# Patient Record
Sex: Male | Born: 1960 | Race: White | Hispanic: No | Marital: Married | Smoking: Never smoker
Health system: Southern US, Community
[De-identification: ages and names within clinical notes are randomized; demographics above are authoritative.]

## PROBLEM LIST (undated history)

## (undated) DIAGNOSIS — I10 Essential (primary) hypertension: Secondary | ICD-10-CM

## (undated) DIAGNOSIS — Z87442 Personal history of urinary calculi: Secondary | ICD-10-CM

## (undated) DIAGNOSIS — N281 Cyst of kidney, acquired: Secondary | ICD-10-CM

## (undated) DIAGNOSIS — K760 Fatty (change of) liver, not elsewhere classified: Secondary | ICD-10-CM

## (undated) DIAGNOSIS — R112 Nausea with vomiting, unspecified: Secondary | ICD-10-CM

## (undated) DIAGNOSIS — Z9889 Other specified postprocedural states: Secondary | ICD-10-CM

## (undated) DIAGNOSIS — K219 Gastro-esophageal reflux disease without esophagitis: Secondary | ICD-10-CM

## (undated) DIAGNOSIS — H9319 Tinnitus, unspecified ear: Secondary | ICD-10-CM

## (undated) DIAGNOSIS — N200 Calculus of kidney: Secondary | ICD-10-CM

## (undated) DIAGNOSIS — K5730 Diverticulosis of large intestine without perforation or abscess without bleeding: Secondary | ICD-10-CM

## (undated) HISTORY — DX: Cyst of kidney, acquired: N28.1

## (undated) HISTORY — DX: Gastro-esophageal reflux disease without esophagitis: K21.9

## (undated) HISTORY — PX: APPENDECTOMY: SHX54

## (undated) HISTORY — DX: Essential (primary) hypertension: I10

## (undated) HISTORY — PX: HERNIA REPAIR: SHX51

## (undated) HISTORY — DX: Fatty (change of) liver, not elsewhere classified: K76.0

## (undated) HISTORY — DX: Calculus of kidney: N20.0

## (undated) HISTORY — DX: Diverticulosis of large intestine without perforation or abscess without bleeding: K57.30

---

## 1993-10-06 DIAGNOSIS — R112 Nausea with vomiting, unspecified: Secondary | ICD-10-CM

## 1993-10-06 DIAGNOSIS — Z9889 Other specified postprocedural states: Secondary | ICD-10-CM

## 1993-10-06 HISTORY — DX: Nausea with vomiting, unspecified: R11.2

## 1993-10-06 HISTORY — DX: Other specified postprocedural states: Z98.890

## 1999-01-12 ENCOUNTER — Emergency Department (HOSPITAL_COMMUNITY): Admission: EM | Admit: 1999-01-12 | Discharge: 1999-01-13 | Payer: Self-pay | Admitting: Emergency Medicine

## 1999-01-13 ENCOUNTER — Encounter: Payer: Self-pay | Admitting: Emergency Medicine

## 2010-10-09 ENCOUNTER — Ambulatory Visit: Payer: Self-pay | Admitting: Cardiology

## 2010-10-11 ENCOUNTER — Emergency Department (HOSPITAL_BASED_OUTPATIENT_CLINIC_OR_DEPARTMENT_OTHER)
Admission: EM | Admit: 2010-10-11 | Discharge: 2010-10-12 | Payer: Self-pay | Source: Home / Self Care | Admitting: Emergency Medicine

## 2010-10-21 LAB — DIFFERENTIAL
Basophils Absolute: 0 10*3/uL (ref 0.0–0.1)
Basophils Relative: 1 % (ref 0–1)
Eosinophils Absolute: 0.2 10*3/uL (ref 0.0–0.7)
Eosinophils Relative: 2 % (ref 0–5)
Lymphocytes Relative: 37 % (ref 12–46)
Lymphs Abs: 3.2 10*3/uL (ref 0.7–4.0)
Monocytes Absolute: 0.9 10*3/uL (ref 0.1–1.0)
Monocytes Relative: 10 % (ref 3–12)
Neutro Abs: 4.2 10*3/uL (ref 1.7–7.7)
Neutrophils Relative %: 50 % (ref 43–77)

## 2010-10-21 LAB — POCT CARDIAC MARKERS
CKMB, poc: <1 ng/mL — ABNORMAL LOW (ref 1.0–8.0)
CKMB, poc: <1 ng/mL — ABNORMAL LOW (ref 1.0–8.0)
CKMB, poc: <1 ng/mL — ABNORMAL LOW (ref 1.0–8.0)
Myoglobin, poc: 36.3 ng/mL (ref 12–200)
Myoglobin, poc: 39.6 ng/mL (ref 12–200)
Myoglobin, poc: 52.8 ng/mL (ref 12–200)
Troponin i, poc: <0.05 ng/mL (ref 0.00–0.09)
Troponin i, poc: <0.05 ng/mL (ref 0.00–0.09)
Troponin i, poc: <0.05 ng/mL (ref 0.00–0.09)

## 2010-10-21 LAB — COMPREHENSIVE METABOLIC PANEL
ALT: 94 U/L — ABNORMAL HIGH (ref 0–53)
AST: 30 U/L (ref 0–37)
Albumin: 4.5 g/dL (ref 3.5–5.2)
Alkaline Phosphatase: 73 U/L (ref 39–117)
BUN: 20 mg/dL (ref 6–23)
CO2: 29 mEq/L (ref 19–32)
Calcium: 9.6 mg/dL (ref 8.4–10.5)
Chloride: 101 mEq/L (ref 96–112)
Creatinine, Ser: 1.3 mg/dL (ref 0.4–1.5)
GFR calc Af Amer: >60 mL/min (ref >60)
GFR calc non Af Amer: 59 mL/min — ABNORMAL LOW (ref >60)
Glucose, Bld: 112 mg/dL — ABNORMAL HIGH (ref 70–99)
Potassium: 4.1 mEq/L (ref 3.5–5.1)
Sodium: 143 mEq/L (ref 135–145)
Total Bilirubin: 0.9 mg/dL (ref 0.3–1.2)
Total Protein: 7.4 g/dL (ref 6.0–8.3)

## 2010-10-21 LAB — CBC
HCT: 46.8 % (ref 39.0–52.0)
Hemoglobin: 16.3 g/dL (ref 13.0–17.0)
MCH: 30.9 pg (ref 26.0–34.0)
MCHC: 34.8 g/dL (ref 30.0–36.0)
MCV: 88.6 fL (ref 78.0–100.0)
Platelets: 140 10*3/uL — ABNORMAL LOW (ref 150–400)
RBC: 5.28 MIL/uL (ref 4.22–5.81)
RDW: 12.9 % (ref 11.5–15.5)
WBC: 8.4 10*3/uL (ref 4.0–10.5)

## 2010-10-21 LAB — D-DIMER, QUANTITATIVE: D-Dimer, Quant: <0.22 ug/mL-FEU (ref 0.00–0.48)

## 2010-10-25 ENCOUNTER — Ambulatory Visit: Payer: Self-pay | Admitting: Cardiology

## 2010-10-27 ENCOUNTER — Encounter: Payer: Self-pay | Admitting: *Deleted

## 2010-10-27 ENCOUNTER — Encounter: Payer: Self-pay | Admitting: Family Medicine

## 2010-10-29 ENCOUNTER — Encounter (HOSPITAL_COMMUNITY): Admission: RE | Admit: 2010-10-29 | Payer: Self-pay | Source: Home / Self Care | Admitting: Cardiology

## 2010-10-31 ENCOUNTER — Telehealth (INDEPENDENT_AMBULATORY_CARE_PROVIDER_SITE_OTHER): Payer: Self-pay | Admitting: Radiology

## 2010-11-04 ENCOUNTER — Encounter (HOSPITAL_COMMUNITY)
Admission: RE | Admit: 2010-11-04 | Discharge: 2010-11-05 | Payer: Self-pay | Source: Home / Self Care | Attending: Cardiology | Admitting: Cardiology

## 2010-11-04 ENCOUNTER — Encounter: Payer: Self-pay | Admitting: Cardiology

## 2010-11-04 ENCOUNTER — Encounter: Payer: Self-pay | Admitting: *Deleted

## 2010-11-04 ENCOUNTER — Ambulatory Visit: Admission: RE | Admit: 2010-11-04 | Discharge: 2010-11-04 | Payer: Self-pay | Source: Home / Self Care

## 2010-11-07 NOTE — Progress Notes (Signed)
Summary: Nuc Pre-Procedure  Phone Note Outgoing Call Call back at Home Phone [phone]   Call placed by: Henrine Screws Call placed to: Patient Reason for Call: Confirm/change Appt Summary of Call: Reviewed information on My[employer] Information Sheet (see scanned document for further details).  Spoke with patient.     Nuclear Med Background Indications for Stress Test: Evaluation for Ischemia, Abnormal EKG   History: Myocardial Perfusion Study  History Comments: 2009- Normal MPS. EF= 53%. Recent ED visit for sinusitis, developed CP, MI ruled out.  Symptoms: Chest Pain, Palpitations    Nuclear Pre-Procedure Cardiac Risk Factors: Family History - CAD, Hypertension, Lipids

## 2010-11-13 NOTE — Assessment & Plan Note (Signed)
Summary: Cardiology Nuclear Testing  Nuclear Med Background Indications for Stress Test: Evaluation for Ischemia, Post Hospital, Abnormal EKG  Indications Comments: 10/12/10 chest pain, MI r/o  History: Myocardial Perfusion Study  History Comments: '09MPS:normal, EF=53%  Symptoms: Chest Pain, DOE, Palpitations  Symptoms Comments: Last episode of [id number].   Nuclear Pre-Procedure Cardiac Risk Factors: Family History - CAD, Hypertension, Lipids Caffeine/Decaff Intake: 2230 NPO After: 7:00 PM Lungs: Clear IV 0.9% NS with Angio Cath: 20g     IV Site: R Hand IV Started by: Cathlyn Parsons, RN Chest Size (in) 42     Height (in): 74 Weight (lb): 207 BMI: 26.67  Nuclear Med Study 1 or 2 day study:  1 day     Stress Test Type:  Stress Reading MD:  Cassell Clement, MD     Referring MD:  Roger Shelter, MD Resting Radionuclide:  Technetium 36m Tetrofosmin     Resting Radionuclide Dose:  11 mCi  Stress Radionuclide:  Technetium 6m Tetrofosmin     Stress Radionuclide Dose:  33 mCi   Stress Protocol Exercise Time (min):  9:00 min     Max HR:  173 bpm     Predicted Max HR:  171 bpm  Max Systolic BP: 181 mm Hg     Percent Max HR:  101.17 %     METS: 10.1 Rate Pressure Product:  64332    Stress Test Technologist:  Rea College, CMA-N     Nuclear Technologist:  Domenic Polite, CNMT  Rest Procedure  Myocardial perfusion imaging was performed at rest 45 minutes following the intravenous administration of Technetium 64m Tetrofosmin.  Stress Procedure  The patient exercised for nine minutes utilizing the Bruce protocol.  The patient stopped due to fatigue and denied any chest pain.  There were no diagnostic ST-T wave changes, only occasional PVC's and PAC's.  Technetium 62m Tetrofosmin was injected at peak exercise and myocardial perfusion imaging was performed after a brief delay.  QPS Raw Data Images:  Normal; no motion artifact; normal heart/lung ratio. Stress Images:  Normal  homogeneous uptake in all areas of the myocardium. Rest Images:  Normal homogeneous uptake in all areas of the myocardium. Subtraction (SDS):  No evidence of ischemia. Transient Ischemic Dilatation:  .97  (Normal <1.22)  Lung/Heart Ratio:  .29  (Normal <0.45)  Quantitative Gated Spect Images QGS EDV:  101 ml QGS ESV:  42 ml QGS EF:  59 %  Findings Normal nuclear study      Overall Impression  Exercise Capacity: Good exercise capacity. BP Response: Normal blood pressure response. Clinical Symptoms: No chest pain ECG Impression: No significant ST segment change suggestive of ischemia. Overall Impression: Normal stress nuclear study. Overall Impression Comments: Normal wall motion.  Appended Document: Cardiology Nuclear Testing copy sent to Dr. Melburn Popper

## 2011-07-29 ENCOUNTER — Other Ambulatory Visit: Payer: Self-pay | Admitting: Family Medicine

## 2011-07-29 DIAGNOSIS — R1011 Right upper quadrant pain: Secondary | ICD-10-CM

## 2011-07-31 ENCOUNTER — Ambulatory Visit
Admission: RE | Admit: 2011-07-31 | Discharge: 2011-07-31 | Disposition: A | Discharge status: Home / Self Care | Payer: 59 | Source: Ambulatory Visit | Attending: Family Medicine | Admitting: Family Medicine

## 2011-07-31 DIAGNOSIS — R1011 Right upper quadrant pain: Secondary | ICD-10-CM

## 2011-09-02 ENCOUNTER — Ambulatory Visit: Payer: Self-pay | Admitting: Cardiology

## 2011-09-10 ENCOUNTER — Encounter: Payer: Self-pay | Admitting: Cardiology

## 2011-09-10 ENCOUNTER — Encounter: Payer: Self-pay | Admitting: *Deleted

## 2011-09-11 ENCOUNTER — Ambulatory Visit (INDEPENDENT_AMBULATORY_CARE_PROVIDER_SITE_OTHER): Payer: 59 | Admitting: Cardiology

## 2011-09-11 ENCOUNTER — Encounter: Payer: Self-pay | Admitting: Cardiology

## 2011-09-11 DIAGNOSIS — E785 Hyperlipidemia, unspecified: Secondary | ICD-10-CM

## 2011-09-11 DIAGNOSIS — I10 Essential (primary) hypertension: Secondary | ICD-10-CM

## 2011-09-11 NOTE — Progress Notes (Signed)
HPI: Pleasant male previously followed by Dr.Tennant for followup of hypertension and chest pain. Last nuclear study was performed in January of 2012. This revealed an ejection fraction of 59% and normal perfusion. Note lipitor caused elevation of LFTs in the past and zocor caused myalgias. He was last seen in Jan 2012.  Since then, the patient denies any dyspnea on exertion, orthopnea, PND, pedal edema, palpitations, syncope or chest pain.   Current Outpatient Prescriptions  Medication Sig Dispense Refill  . ASTEPRO 0.15 % SOLN As directed      . BENICAR HCT 20-12.5 MG per tablet 1/2 TAB PO QD       . LEVITRA 20 MG tablet PRN      . NASONEX 50 MCG/ACT nasal spray prn      . RABEprazole (ACIPHEX) 20 MG tablet Take 20 mg by mouth daily.        Marland Kitchen zolpidem (AMBIEN) 10 MG tablet Take 1 tablet by mouth Daily.         Past Medical History  Diagnosis Date  . Hyperlipidemia   . Pneumothorax   . Hypertension     Past Surgical History  Procedure Date  . Appendectomy   . Hernia repair     History   Social History  . Marital Status: Legally Separated    Spouse Name: N/A    Number of Children: N/A  . Years of Education: N/A   Occupational History  . Not on file.   Social History Main Topics  . Smoking status: Never Smoker   . Smokeless tobacco: Not on file  . Alcohol Use: Not on file  . Drug Use: Not on file  . Sexually Active: Not on file   Other Topics Concern  . Not on file   Social History Narrative  . No narrative on file    ROS: some problems with reflux and epigastric discomfort that increases with lying on his side but no fevers or chills, productive cough, hemoptysis, dysphasia, odynophagia, melena, hematochezia, dysuria, hematuria, rash, seizure activity, orthopnea, PND, pedal edema, claudication. Remaining systems are negative.  Physical Exam: Well-developed well-nourished in no acute distress.  Skin is warm and dry.  HEENT is normal.  Neck is supple. No  thyromegaly.  Chest is clear to auscultation with normal expansion.  Cardiovascular exam is regular rate and rhythm.  Abdominal exam nontender or distended. No masses palpated. Extremities show no edema. neuro grossly intact  ECG NSR with nonspecific ST changes.

## 2011-09-11 NOTE — Assessment & Plan Note (Signed)
Management per primary care. 

## 2011-09-11 NOTE — Patient Instructions (Signed)
Your physician wants you to follow-up in:  12 months.  You will receive a reminder letter in the mail two months in advance. If you don't receive a letter, please call our office to schedule the follow-up appointment.   

## 2011-09-11 NOTE — Assessment & Plan Note (Signed)
Blood pressure controlled. Continue present medications. Potassium and renal function monitored by primary care. He does have a family history of coronary disease. Plan followup stress test in the future.

## 2011-10-07 HISTORY — PX: COLONOSCOPY: SHX174

## 2012-04-11 IMAGING — US US ABDOMEN COMPLETE
1 series · 13 of 25 positions shown · non-contrast
Comparison: Report of CT abdomen and pelvis 01/13/1999 describing
a left UVJ calculus; those images have been purged.  No prior
ultrasound.

CLINICAL DATA: Chronic right upper quadrant abdominal pain over
the past year or so.

COMPLETE ABDOMINAL ULTRASOUND 07/31/2011:

[Series 1: us abdomen complete · 0.28mm/px · 13 of 61 slices shown]
[im 1/61]
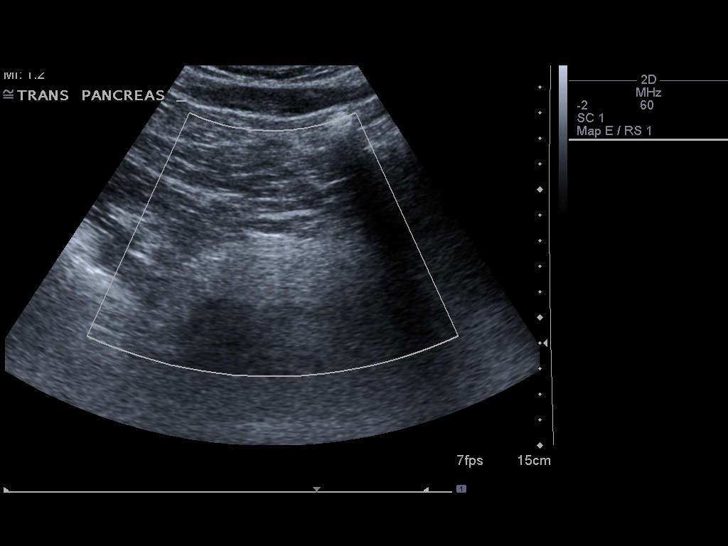
[im 6/61]
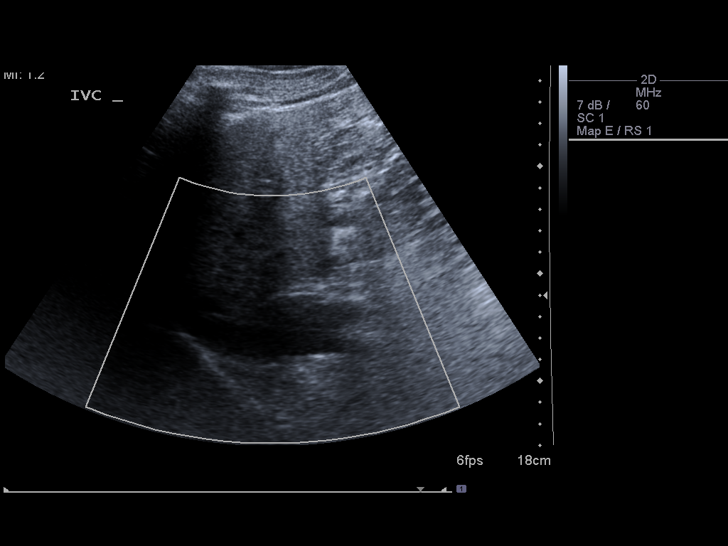
[im 11/61]
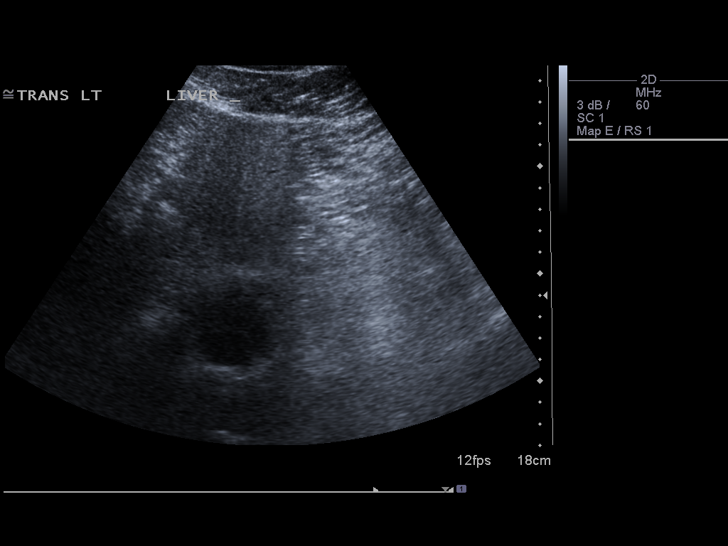
[im 16/61]
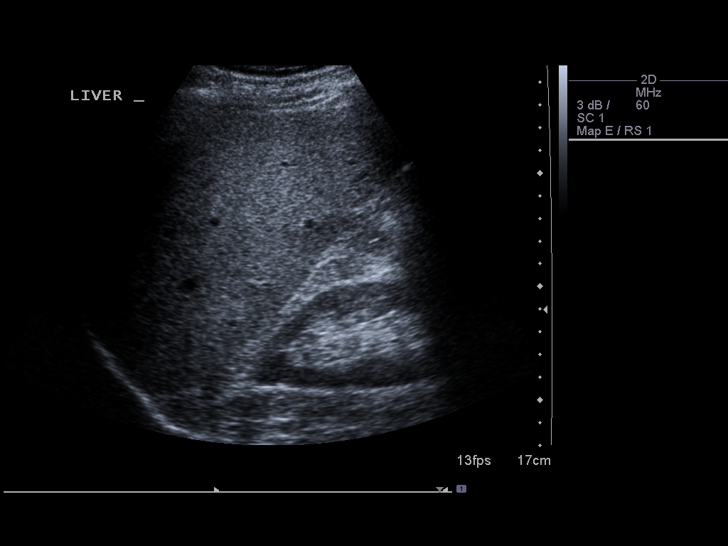
[im 21/61]
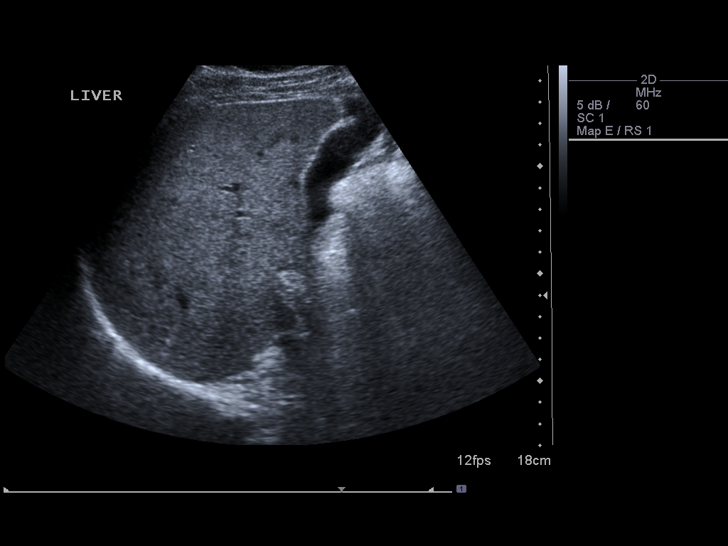
[im 26/61]
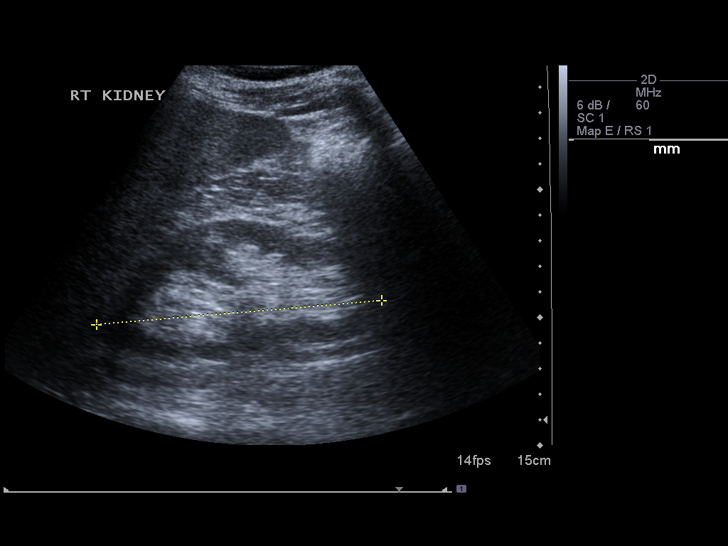
[im 31/61]
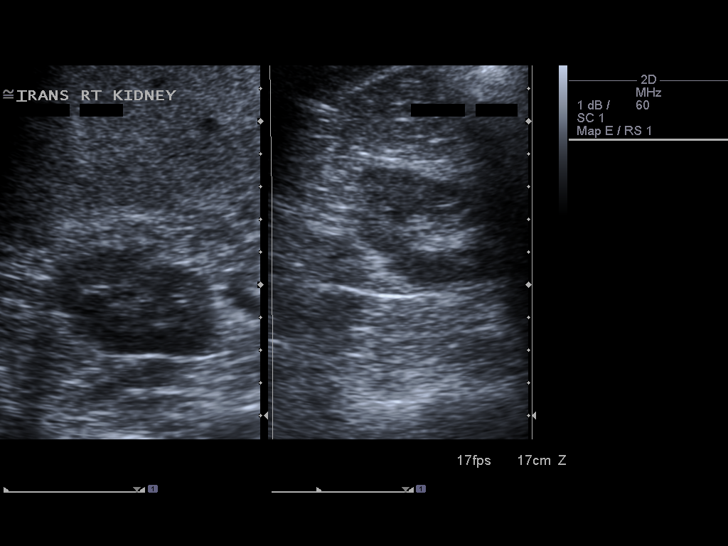
[im 36/61]
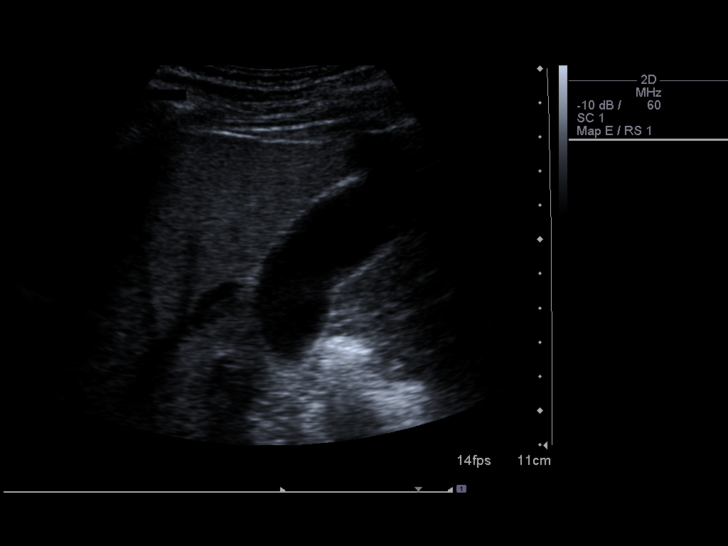
[im 41/61]
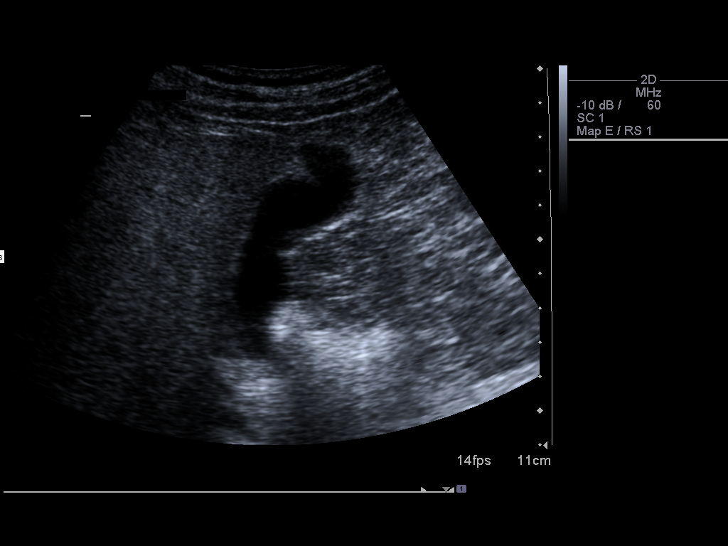
[im 46/61]
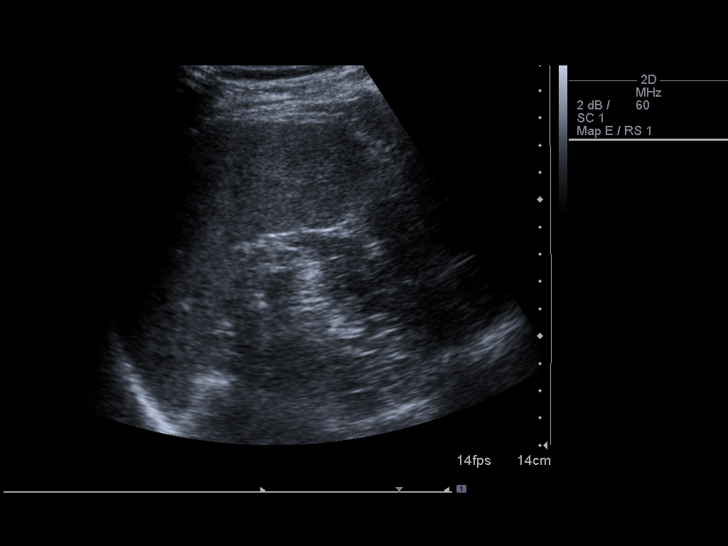
[im 51/61]
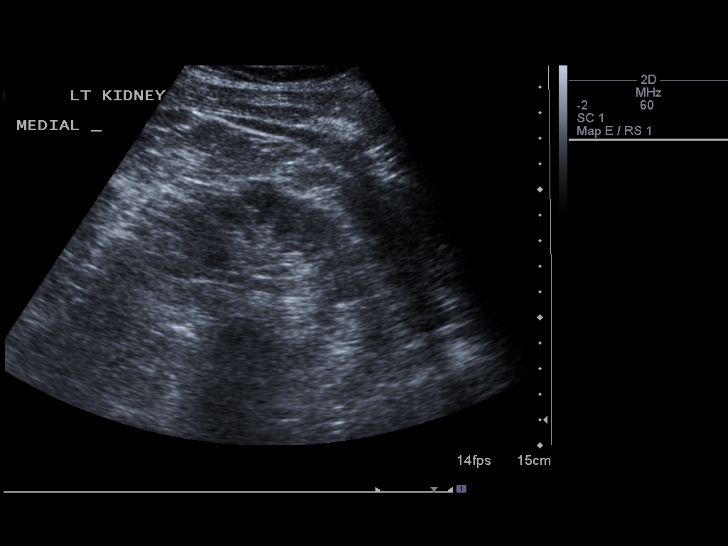
[im 56/61]
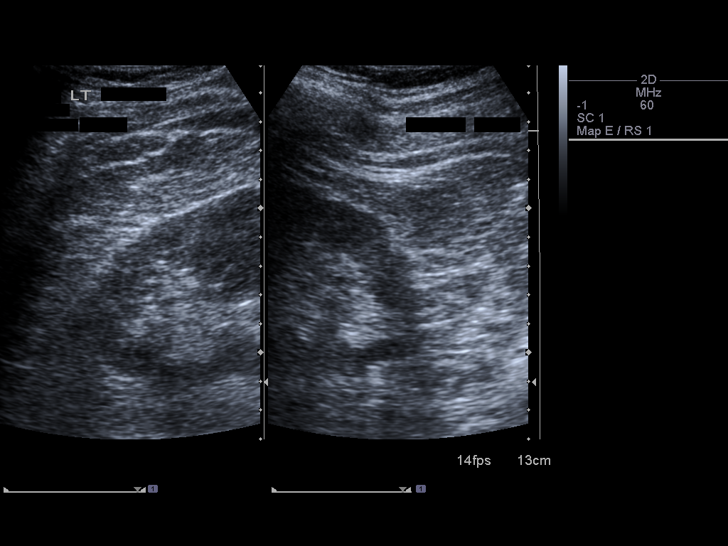
[im 61/61]
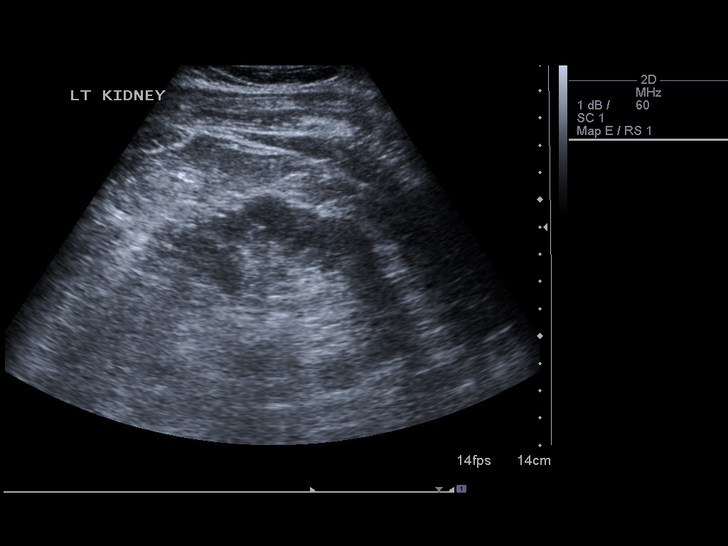

[13 of 25 positions shown; findings below may reference images not displayed]

FINDINGS: Gallbladder:  No shadowing gallstones or echogenic sludge.  No
gallbladder wall thickening or pericholecystic fluid.  Negative
sonographic Murphy's sign according to the ultrasound technologist.

Common bile duct:  Normal in caliber with maximum diameter
approximating 2 mm.

Liver:  Normal in size with diffuselyincreased and coarsened
echotexture.  No focal hepatic parenchymal abnormality.  Patent
portal vein with hepatopedal flow.

IVC:  Patent in its intrahepatic portion.  Obscured outside the
liver by bowel gas.

Pancreas:  Normal appearing head and body.  Tail obscured by
overlying bowel gas.

Spleen:  Normal size and echotexture without focal parenchymal
abnormality.

Right Kidney:  No hydronephrosis.  Well-preserved cortex.  No
shadowing calculi.  Normal size and parenchymal echotexture without
focal abnormalities.  Approximately 11.2 cm in length.

Left Kidney:  No hydronephrosis.  Well-preserved cortex.  No
shadowing calculi.  Normal size and parenchymal echotexture.
Approximate 1.7 cm likely complex cyst arising from the mid kidney.
No other focal parenchymal abnormalities.  Approximately 12.6 cm in
length.

Abdominal aorta:  Normal in caliber throughout its visualized
course in the abdomen without significant atherosclerosis.
IMPRESSION: 1.  Diffuse hepatic steatosis and/or hepatocellular disease.  No
focal hepatic parenchymal abnormality.
2.  Probable 1.7 cm complex cyst arising from the mid left kidney.
As this was not mentioned on the report of the remote unenhanced
CT, follow up urinary tract ultrasound in 6 months may be helpful
to confirm stability, as it does not fulfill ultrasound criteria
for a simple cyst.

3.  Otherwise normal examination with a caveat that the
extrahepatic IVC and the pancreatic tail were obscured by overlying
bowel gas and were therefore not evaluated.

## 2012-10-06 DIAGNOSIS — N281 Cyst of kidney, acquired: Secondary | ICD-10-CM

## 2012-10-06 HISTORY — DX: Cyst of kidney, acquired: N28.1

## 2013-01-04 ENCOUNTER — Ambulatory Visit (INDEPENDENT_AMBULATORY_CARE_PROVIDER_SITE_OTHER): Payer: 59 | Admitting: Family Medicine

## 2013-01-04 ENCOUNTER — Encounter: Payer: Self-pay | Admitting: Family Medicine

## 2013-01-04 VITALS — BP 120/84 | HR 76 | Temp 98.0°F | Resp 16 | Ht 74.0 in | Wt 219.0 lb

## 2013-01-04 DIAGNOSIS — M722 Plantar fascial fibromatosis: Secondary | ICD-10-CM

## 2013-01-04 NOTE — Progress Notes (Signed)
Office Note 01/05/2013  CC:  Chief Complaint  Patient presents with  . Establish Care    left heel pain x 6 months getting worse    HPI:  Donald [name] is a 52 y.o. White male who is here to establish care and discuss heel pain. Patient's most recent primary MD: Dr. Rosezetta Schlatter at [facility].  [facility]. Old records were not reviewed prior to or during today's visit.  Reviewed hx. Pt with acute complaint of 6-8 mo hx of left heel pain, waxes and wanes in intensity. Says it is a sharp ache focused on bottom of heel that is the worst right upon getting out of bed in morning.  Also gets worse later in day after he's been on it walking/standing a long time.  No hx of injury/trauma.  Rest/non wt bearing helps. Ibuprofen 400mg  prn helps a little--not taking this with any regularity due to hx of dyspepsia/gerd.  Past Medical History  Diagnosis Date  . Hyperlipidemia     Hx of statin intolerance  . Pneumothorax     Spontaneous   . Hypertension   . Hepatic steatosis 2012 u/s  . Cyst of left kidney 2012 u/s    ? complex (f/u renal u/s done to document stability?)  . Nephrolithiasis 2000    Past Surgical History  Procedure Laterality Date  . Appendectomy    . Hernia repair  1995; 2000    Right spigelian hernia.  Umbill hern  . Colonoscopy  2013    Adenomatous polyp; recall 5 yrs (Dr. Loreta Ave)  . Esophagogastroduodenoscopy      Family History  Problem Relation Age of Onset  . Coronary artery disease    . Early death Mother     MI/CAD  . Heart disease Mother     History   Social History  . Marital Status: Legally Separated    Spouse Name: N/A    Number of Children: N/A  . Years of Education: N/A   Occupational History  . Not on file.   Social History Main Topics  . Smoking status: Never Smoker   . Smokeless tobacco: Never Used  . Alcohol Use: No  . Drug Use: No  . Sexually Active: Not on file   Other Topics Concern  . Not on file    Social History Narrative   Divorced, 2 kids (young adults).   HS education.   Lab tech for [employer]   No T/A/Ds.    Outpatient Encounter Prescriptions as of 01/04/2013  Medication Sig Dispense Refill  . aspirin 81 MG tablet Take 81 mg by mouth daily.      Marland Kitchen BENICAR HCT 20-12.5 MG per tablet Take 0.5 tablets by mouth daily.       Marland Kitchen LEVITRA 20 MG tablet Take 20 mg by mouth daily as needed.       Marland Kitchen NASONEX 50 MCG/ACT nasal spray Place 2 sprays into the nose daily.       . RABEprazole (ACIPHEX) 20 MG tablet Take 20 mg by mouth daily.        Marland Kitchen zolpidem (AMBIEN) 10 MG tablet Take 1 tablet by mouth Daily.      . [DISCONTINUED] ASTEPRO 0.15 % SOLN As directed       No facility-administered encounter medications on file as of 01/04/2013.    No Known Allergies  ROS Review of Systems  Constitutional: Negative for fever and fatigue.  HENT: Negative for congestion and sore throat.   Eyes: Negative  for visual disturbance.  Respiratory: Negative for cough.   Cardiovascular: Negative for chest pain.  Gastrointestinal: Negative for nausea and abdominal pain.       Dyspepsia  Genitourinary: Negative for dysuria.  Musculoskeletal: Negative for back pain and joint swelling.  Skin: Negative for rash.  Neurological: Negative for weakness and headaches.  Hematological: Negative for adenopathy.  Psychiatric/Behavioral: Negative for dysphoric mood.    PE; Blood pressure 120/84, pulse 76, temperature 98 F (36.7 C), temperature source Temporal, resp. rate 16, height 6\' 2"  (1.88 m), weight 219 lb (99.338 kg). Gen: Alert, well appearing.  Patient is oriented to person, place, time, and situation. AFFECT: pleasant, lucid thought and speech. ENT:   Eyes: no injection, icteris, swelling, or exudate.  EOMI, PERRLA. Nose: no drainage or turbinate edema/swelling.  No injection or focal lesion.  Mouth: lips without lesion/swelling.  Oral mucosa pink and moist.  Dentition intact and without obvious  caries or gingival swelling.  Oropharynx without erythema, exudate, or swelling.  CV: RRR, no m/r/g.   LUNGS: CTA bilat, nonlabored resps, good aeration in all lung fields. ABD: soft, NT/ND BS+, no bruit or mass. EXT: no clubbing, cyanosis, or edema.  Left achilles region nontender, posterior calcaneus nontender.  Entire calcaneus currently without tenderness. Plantar fascia nontender.   No erythema or swelling.  ROM of ankle and foot intact.    Pertinent labs:  none  ASSESSMENT AND PLAN:   New pt; obtain old records.  Plantar fasciitis of left foot Discussed topical anti-inflammitory compounded by Dermatran. Post-op/hard soled shoe use discussed. Gentle massage of plantar fascia prn.  An After Visit Summary was printed and given to the patient.  Return if symptoms worsen or fail to improve.

## 2013-01-04 NOTE — Assessment & Plan Note (Signed)
Discussed topical anti-inflammitory compounded by Dermatran. Post-op/hard soled shoe use discussed. Gentle massage of plantar fascia prn.

## 2013-04-25 ENCOUNTER — Encounter: Payer: Self-pay | Admitting: Family Medicine

## 2013-04-25 ENCOUNTER — Ambulatory Visit (INDEPENDENT_AMBULATORY_CARE_PROVIDER_SITE_OTHER): Payer: 59 | Admitting: Family Medicine

## 2013-04-25 VITALS — BP 135/86 | HR 76 | Temp 98.5°F | Resp 18 | Ht 74.0 in | Wt 218.0 lb

## 2013-04-25 DIAGNOSIS — K219 Gastro-esophageal reflux disease without esophagitis: Secondary | ICD-10-CM

## 2013-04-25 DIAGNOSIS — Q619 Cystic kidney disease, unspecified: Secondary | ICD-10-CM

## 2013-04-25 DIAGNOSIS — M722 Plantar fascial fibromatosis: Secondary | ICD-10-CM

## 2013-04-25 DIAGNOSIS — I10 Essential (primary) hypertension: Secondary | ICD-10-CM

## 2013-04-25 DIAGNOSIS — N281 Cyst of kidney, acquired: Secondary | ICD-10-CM | POA: Insufficient documentation

## 2013-04-25 MED ORDER — OLMESARTAN MEDOXOMIL-HCTZ 20-12.5 MG PO TABS: 0.5000 | ORAL_TABLET | Freq: Every day | ORAL | Status: DC

## 2013-04-25 MED ORDER — RABEPRAZOLE SODIUM 20 MG PO TBEC: 20.0000 mg | DELAYED_RELEASE_TABLET | Freq: Every day | ORAL | Status: DC

## 2013-04-25 NOTE — Assessment & Plan Note (Signed)
Problem stable.  Continue current medications and diet appropriate for this condition.  We have reviewed our general long term plan for this problem and also reviewed symptoms and signs that should prompt the patient to call or return to the office. RF'd med today.

## 2013-04-25 NOTE — Assessment & Plan Note (Signed)
Bothering him minimally at this point. Continue shoe insert prn.

## 2013-04-25 NOTE — Assessment & Plan Note (Signed)
Problem stable.  Continue current medications and diet appropriate for this condition.  We have reviewed our general long term plan for this problem and also reviewed symptoms and signs that should prompt the patient to call or return to the office.  

## 2013-04-25 NOTE — Assessment & Plan Note (Signed)
Pt did not know about this finding on his 2012 u/s. Will arrange renal u/s to try to document stability since this finding was not convincingly a simple cyst OR a complex cyst.

## 2013-04-25 NOTE — Progress Notes (Signed)
OFFICE NOTE  04/25/2013  CC:  Chief Complaint  Patient presents with  . Plantar Fasciitis    f/u  . Medication Refill     HPI: Patient is a 52 y.o. Caucasian male who is here for 3 and 1/2 month f/u for HTN and left foot plantar fasciitis. Left heel pain is waxing/waning--no signif response to the dermatran anti--inflam cream but he uses some kind of OTC cream + sole inserts that help.   Checks bp weekly at [facility] and it is normal. Takes aciphex daily.  If he goes a few days without it and tries to take OTC H2 blocker he feels significant GER. Asks for RFs of aciphex and bp med today.  Pertinent PMH:  Past Medical History  Diagnosis Date  . Hyperlipidemia     Hx of statin intolerance  . Pneumothorax     Spontaneous   . Hypertension   . Hepatic steatosis 2012 u/s  . Cyst of left kidney 2012 u/s    ? complex (f/u renal u/s done to document stability?)  . Nephrolithiasis 2000   Past Surgical History  Procedure Laterality Date  . Appendectomy    . Hernia repair  1995; 2000    Right spigelian hernia.  Umbill hern  . Colonoscopy  2013    Adenomatous polyp; recall 5 yrs (Dr. Loreta Ave)  . Esophagogastroduodenoscopy     Past family and social history reviewed and there are no changes since the patient's last office visit with me.  MEDS:  Outpatient Prescriptions Prior to Visit  Medication Sig Dispense Refill  . aspirin 81 MG tablet Take 81 mg by mouth daily.      Marland Kitchen BENICAR HCT 20-12.5 MG per tablet Take 0.5 tablets by mouth daily.       Marland Kitchen LEVITRA 20 MG tablet Take 20 mg by mouth daily as needed.       Marland Kitchen NASONEX 50 MCG/ACT nasal spray Place 2 sprays into the nose daily.       . RABEprazole (ACIPHEX) 20 MG tablet Take 20 mg by mouth daily.        Marland Kitchen zolpidem (AMBIEN) 10 MG tablet Take 1 tablet by mouth Daily.       No facility-administered medications prior to visit.    PE: Blood pressure 135/86, pulse 76, temperature 98.5 F (36.9 C), temperature source  Temporal, resp. rate 18, height 6\' 2"  (1.88 m), weight 218 lb (98.884 kg), SpO2 97.00%. Gen: Alert, well appearing.  Patient is oriented to person, place, time, and situation. Neck: supple/nontender.  No LAD, mass, or TM.  Carotid pulses 2+ bilaterally, without bruits. CV: RRR, no m/r/g.   LUNGS: CTA bilat, nonlabored resps, good aeration in all lung fields.  LABS: none today.  IMPRESSION AND PLAN:  Plantar fasciitis of left foot Bothering him minimally at this point. Continue shoe insert prn.  Hypertension Problem stable.  Continue current medications and diet appropriate for this condition.  We have reviewed our general long term plan for this problem and also reviewed symptoms and signs that should prompt the patient to call or return to the office.   GERD (gastroesophageal reflux disease) Problem stable.  Continue current medications and diet appropriate for this condition.  We have reviewed our general long term plan for this problem and also reviewed symptoms and signs that should prompt the patient to call or return to the office. RF'd med today.  Cyst of left kidney Pt did not know about this finding on his  2012 u/s. Will arrange renal u/s to try to document stability since this finding was not convincingly a simple cyst OR a complex cyst.   An After Visit Summary was printed and given to the patient.  FOLLOW UP: at his earliest convenience for CPE (morning/fasting--we'll check labs at that time).

## 2013-04-27 ENCOUNTER — Telehealth: Payer: Self-pay | Admitting: Family Medicine

## 2013-04-27 NOTE — Telephone Encounter (Signed)
Sorry, I just did 90 d supply after confirming with him that he takes 1/2 per day and 45 tabs is = to 90 day supply for this med.  -thx

## 2013-04-27 NOTE — Telephone Encounter (Signed)
Patient states that he needs a new rx for his benicar. Patient states that he needs it for one daily instead of 0.5 tab.  Please advise.

## 2013-04-27 NOTE — Telephone Encounter (Signed)
Patient now states that he is taking 1 pill daily after confirming with myself and Dr Milinda Cave at his visit that he only takes half tab daily.  Patient also told pharmacy that he only takes a half tab daily but wants a full tab so he doesn't have to pay for it every month.  Pharmacy stated that is insurance fraud and they would not fill anymore without Dr permission.  Dr Milinda Cave will not up dosage so he can just pay for it every other month.  Patient was very frustrated.  I apologized to him and told him that we could not help him in this matter and that if he actually needs the 1 tab daily that he would have to talk to Dr Milinda Cave about this at his next office visit.

## 2013-04-28 ENCOUNTER — Ambulatory Visit (HOSPITAL_BASED_OUTPATIENT_CLINIC_OR_DEPARTMENT_OTHER): Payer: 59

## 2013-05-03 ENCOUNTER — Telehealth: Payer: Self-pay | Admitting: Family Medicine

## 2013-05-03 NOTE — Telephone Encounter (Signed)
For patient's chart:  Hi, This patient cancelled his renal US. Is it okay if I cancel the order? Thanks, Diane      OK to cancel order.--PM

## 2013-05-11 ENCOUNTER — Other Ambulatory Visit: Payer: Self-pay | Admitting: Family Medicine

## 2013-05-11 DIAGNOSIS — N281 Cyst of kidney, acquired: Secondary | ICD-10-CM

## 2013-05-16 ENCOUNTER — Ambulatory Visit
Admission: RE | Admit: 2013-05-16 | Discharge: 2013-05-16 | Disposition: A | Discharge status: Home / Self Care | Payer: 59 | Source: Ambulatory Visit | Attending: Family Medicine | Admitting: Family Medicine

## 2013-05-16 DIAGNOSIS — N281 Cyst of kidney, acquired: Secondary | ICD-10-CM

## 2013-09-20 ENCOUNTER — Other Ambulatory Visit: Payer: Self-pay | Admitting: Gastroenterology

## 2013-09-20 DIAGNOSIS — R1013 Epigastric pain: Secondary | ICD-10-CM

## 2013-10-06 HISTORY — PX: ESOPHAGOGASTRODUODENOSCOPY: SHX1529

## 2013-10-12 ENCOUNTER — Ambulatory Visit (HOSPITAL_COMMUNITY)
Admission: RE | Admit: 2013-10-12 | Discharge: 2013-10-12 | Disposition: A | Discharge status: Home / Self Care | Payer: 59 | Source: Ambulatory Visit | Attending: Gastroenterology | Admitting: Gastroenterology

## 2013-10-12 ENCOUNTER — Encounter (HOSPITAL_COMMUNITY)
Admission: RE | Admit: 2013-10-12 | Discharge: 2013-10-12 | Disposition: A | Discharge status: Home / Self Care | Payer: 59 | Source: Ambulatory Visit | Attending: Gastroenterology | Admitting: Gastroenterology

## 2013-10-12 DIAGNOSIS — R932 Abnormal findings on diagnostic imaging of liver and biliary tract: Secondary | ICD-10-CM | POA: Insufficient documentation

## 2013-10-12 DIAGNOSIS — R1013 Epigastric pain: Secondary | ICD-10-CM

## 2013-10-12 DIAGNOSIS — N281 Cyst of kidney, acquired: Secondary | ICD-10-CM | POA: Insufficient documentation

## 2013-10-12 MED ORDER — TECHNETIUM TC 99M MEBROFENIN IV KIT
5.3000 | PACK | Freq: Once | INTRAVENOUS | Status: AC | PRN
  Administered 2013-10-12: 5 via INTRAVENOUS

## 2013-10-26 ENCOUNTER — Encounter (INDEPENDENT_AMBULATORY_CARE_PROVIDER_SITE_OTHER): Payer: Self-pay | Admitting: Surgery

## 2013-10-31 ENCOUNTER — Ambulatory Visit (INDEPENDENT_AMBULATORY_CARE_PROVIDER_SITE_OTHER): Payer: 59 | Admitting: Surgery

## 2013-10-31 ENCOUNTER — Encounter (INDEPENDENT_AMBULATORY_CARE_PROVIDER_SITE_OTHER): Payer: Self-pay | Admitting: Surgery

## 2013-10-31 VITALS — BP 110/84 | HR 88 | Temp 98.4°F | Resp 15 | Ht 74.0 in | Wt 210.8 lb

## 2013-10-31 DIAGNOSIS — K828 Other specified diseases of gallbladder: Secondary | ICD-10-CM | POA: Insufficient documentation

## 2013-10-31 NOTE — Progress Notes (Signed)
Patient ID: Donald [name], male   DOB: [birth year], 53 y.o.   MRN: [id number]  Chief Complaint  Patient presents with  . Initial Prenatal Visit    eval Gb    HPI Donald [name] is a 53 y.o. male.   HPI This is a pleasant gentleman referred by Dr. Loreta AveMann. He has had epigastric abdominal pain. Due to his back and into his chest for several years. He has bloating with this. He concurred in the right upper quadrant lying on his side. It is exacerbated with fatty meals. He can eat and have the symptoms without eating. He has been on multiple medications without any help. He has no problems moving his bowels. He has had occasional nausea but no vomiting. He has had an extensive workup for the symptoms. The discomfort can be moderate to severe. Past Medical History  Diagnosis Date  . Hyperlipidemia     Hx of statin intolerance+ LFT elevations  . Pneumothorax     Spontaneous   . Hypertension   . Hepatic steatosis 2012 u/s  . Cyst of left kidney 2012 u/s    ? complex (f/u renal u/s done to document stability?)  . Nephrolithiasis 2000  . GERD (gastroesophageal reflux disease)     Past Surgical History  Procedure Laterality Date  . Appendectomy    . Hernia repair  1995; 2000    Right spigelian hernia.  Umbill hern  . Colonoscopy  2013    Adenomatous polyp; recall 5 yrs (Dr. Loreta AveMann)  . Esophagogastroduodenoscopy      Family History  Problem Relation Age of Onset  . Coronary artery disease    . Heart disease Mother   . Early death Father     cad  . Heart disease Father     Social History History  Substance Use Topics  . Smoking status: Never Smoker   . Smokeless tobacco: Never Used  . Alcohol Use: No    No Known Allergies  Current Outpatient Prescriptions  Medication Sig Dispense Refill  . LEVITRA 20 MG tablet Take 20 mg by mouth daily as needed.       Marland Kitchen. NASONEX 50 MCG/ACT nasal spray Place 2 sprays into the nose daily.       Marland Kitchen. olmesartan-hydrochlorothiazide (BENICAR  HCT) 20-12.5 MG per tablet Take 0.5 tablets by mouth daily.  45 tablet  1  . RABEprazole (ACIPHEX) 20 MG tablet Take 1 tablet (20 mg total) by mouth daily.  90 tablet  1  . zolpidem (AMBIEN) 10 MG tablet Take 1 tablet by mouth Daily.      Marland Kitchen. aspirin 81 MG tablet Take 81 mg by mouth daily.       No current facility-administered medications for this visit.    Review of Systems Review of Systems  Constitutional: Negative for fever, chills and unexpected weight change.  HENT: Negative for congestion, hearing loss, sore throat, trouble swallowing and voice change.   Eyes: Negative for visual disturbance.  Respiratory: Negative for cough and wheezing.   Cardiovascular: Negative for chest pain, palpitations and leg swelling.  Gastrointestinal: Positive for nausea, abdominal pain and abdominal distention. Negative for vomiting, diarrhea, constipation, blood in stool, anal bleeding and rectal pain.  Genitourinary: Negative for hematuria and difficulty urinating.  Musculoskeletal: Negative for arthralgias.  Skin: Negative for rash and wound.  Neurological: Negative for seizures, syncope, weakness and headaches.  Hematological: Negative for adenopathy. Does not bruise/bleed easily.  Psychiatric/Behavioral: Negative for confusion.    Blood pressure 110/84,  pulse 88, temperature 98.4 F (36.9 C), temperature source Temporal, resp. rate 15, height 6\' 2"  (1.88 m), weight 210 lb 12.8 oz (95.618 kg).  Physical Exam Physical Exam  Constitutional: He is oriented to person, place, and time. He appears well-developed and well-nourished. No distress.  HENT:  Head: Normocephalic and atraumatic.  Right Ear: External ear normal.  Left Ear: External ear normal.  Nose: Nose normal.  Mouth/Throat: Oropharynx is clear and moist. No oropharyngeal exudate.  Eyes: Conjunctivae are normal. Pupils are equal, round, and reactive to light. Right eye exhibits no discharge. Left eye exhibits no discharge. No scleral  icterus.  Neck: Normal range of motion. Neck supple. No tracheal deviation present.  Cardiovascular: Normal rate, normal heart sounds and intact distal pulses.   No murmur heard. Pulmonary/Chest: Effort normal and breath sounds normal. No respiratory distress. He has no wheezes. He has no rales.  Abdominal: Soft. There is tenderness. There is guarding.  There is mild epigastric tenderness with guarding also in the right upper quadrant  Musculoskeletal: Normal range of motion.  Lymphadenopathy:    He has no cervical adenopathy.  Neurological: He is alert and oriented to person, place, and time.  Skin: Skin is warm. No rash noted. He is not diaphoretic. No erythema.  Psychiatric: His behavior is normal. Judgment normal.    Data Reviewed He has a hida scan showing a gallbladder ejection fraction of 31%. His symptoms were reproduced with fatty ingestion during the test.    Assessment    Biliary dyskinesia     Plan    I think the gallbladder is the source of his discomfort as his other workup have been unremarkable. I suspect he does have chronic cholecystitis. I discussed laparoscopic cholecystectomy with him in detail. I discussed the risks of surgery which includes but is not limited to bleeding, infection, injury to surrounding structures, bile leak, the need to convert to an open procedure, and the chance this may not resolve his symptoms. I also discussed postoperative recovery. He discussed this with his wife and wishes to proceed with laparoscopic cholecystectomy. Surgery is scheduled       Sajjad Honea A 10/31/2013, 11:12 AM

## 2013-11-01 ENCOUNTER — Encounter (HOSPITAL_COMMUNITY): Payer: Self-pay | Admitting: Pharmacy Technician

## 2013-11-02 ENCOUNTER — Other Ambulatory Visit (HOSPITAL_COMMUNITY): Payer: Self-pay | Admitting: Surgery

## 2013-11-02 NOTE — Patient Instructions (Addendum)
20 Bronson CurbDavid L Depaz  11/02/2013   Your procedure is scheduled on: Tuesday February 3rd  Report to [facility] at 730 AM.  Call this number if you have problems the morning of surgery [phone]   Remember: NO VISITORS UNDER AGE 53 PER [facility] FLU POLICY.   Do not eat food or drink liquids :After Midnight.    Take these medicines the morning of surgery with A SIP OF WATER: nasonex if needed                                SEE [place] PREPARING FOR SURGERY SHEET             You may not have any metal on your body including hair pins and piercings  Do not wear jewelry, make-up.  Do not wear lotions, powders, or perfumes. You may wear deodorant.   Men may shave face and neck.  Do not bring valuables to the hospital. [facility] IS NOT RESPONSIBLE FOR VALUEABLES.     Patients discharged the day of surgery will not be allowed to drive home.  Name and phone number of your driver: Alvis LemmingsDawn [phone]  Please read over the following fact sheets that you were given: [facility] Preparing for [facility] sheet  Call Birdie Sonsachel Claudell Wohler  RN pre op nurse if needed [phone]    FAILURE TO FOLLOW THESE INSTRUCTIONS MAY RESULT IN THE CANCELLATION OF YOUR SURGERY.  PATIENT SIGNATURE___________________________________________  NURSE SIGNATURE_____________________________________________

## 2013-11-03 ENCOUNTER — Encounter (HOSPITAL_COMMUNITY)
Admission: RE | Admit: 2013-11-03 | Discharge: 2013-11-03 | Disposition: A | Discharge status: Home / Self Care | Payer: 59 | Source: Ambulatory Visit | Attending: Surgery | Admitting: Surgery

## 2013-11-03 ENCOUNTER — Encounter (HOSPITAL_COMMUNITY): Payer: Self-pay

## 2013-11-03 ENCOUNTER — Ambulatory Visit (HOSPITAL_COMMUNITY)
Admission: RE | Admit: 2013-11-03 | Discharge: 2013-11-03 | Disposition: A | Discharge status: Home / Self Care | Payer: 59 | Source: Ambulatory Visit | Attending: Surgery | Admitting: Surgery

## 2013-11-03 DIAGNOSIS — Z01812 Encounter for preprocedural laboratory examination: Secondary | ICD-10-CM | POA: Insufficient documentation

## 2013-11-03 DIAGNOSIS — Z01818 Encounter for other preprocedural examination: Secondary | ICD-10-CM | POA: Insufficient documentation

## 2013-11-03 DIAGNOSIS — K828 Other specified diseases of gallbladder: Secondary | ICD-10-CM | POA: Insufficient documentation

## 2013-11-03 DIAGNOSIS — Z01810 Encounter for preprocedural cardiovascular examination: Secondary | ICD-10-CM | POA: Insufficient documentation

## 2013-11-03 HISTORY — DX: Nausea with vomiting, unspecified: R11.2

## 2013-11-03 HISTORY — DX: Other specified postprocedural states: Z98.890

## 2013-11-03 HISTORY — DX: Tinnitus, unspecified ear: H93.19

## 2013-11-03 HISTORY — DX: Personal history of urinary calculi: Z87.442

## 2013-11-03 LAB — BASIC METABOLIC PANEL
BUN: 16 mg/dL (ref 6–23)
CO2: 27 mEq/L (ref 19–32)
Calcium: 9.5 mg/dL (ref 8.4–10.5)
Chloride: 97 mEq/L (ref 96–112)
Creatinine, Ser: 1.33 mg/dL (ref 0.50–1.35)
GFR calc Af Amer: 70 mL/min — ABNORMAL LOW (ref >90)
GFR, EST NON AFRICAN AMERICAN: 60 mL/min — AB (ref >90)
Glucose, Bld: 102 mg/dL — ABNORMAL HIGH (ref 70–99)
Potassium: 4.1 mEq/L (ref 3.7–5.3)
Sodium: 138 mEq/L (ref 137–147)

## 2013-11-03 LAB — CBC
HCT: 48.1 % (ref 39.0–52.0)
HEMOGLOBIN: 16.8 g/dL (ref 13.0–17.0)
MCH: 31.6 pg (ref 26.0–34.0)
MCHC: 34.9 g/dL (ref 30.0–36.0)
MCV: 90.4 fL (ref 78.0–100.0)
PLATELETS: 202 10*3/uL (ref 150–400)
RBC: 5.32 MIL/uL (ref 4.22–5.81)
RDW: 12.8 % (ref 11.5–15.5)
WBC: 7.2 10*3/uL (ref 4.0–10.5)

## 2013-11-07 NOTE — Progress Notes (Signed)
Spoke with pt by phone aware surgery time changed, arrive 830 am [facility] 11-08-13

## 2013-11-07 NOTE — H&P (Signed)
Chief Complaint   Patient presents with   .  Initial Prenatal Visit     eval Gb   HPI  Donald [name] is a 53 y.o. male.  HPI  This is a pleasant gentleman referred by Dr. Loreta AveMann. He has had epigastric abdominal pain. Due to his back and into his chest for several years. He has bloating with this. He concurred in the right upper quadrant lying on his side. It is exacerbated with fatty meals. He can eat and have the symptoms without eating. He has been on multiple medications without any help. He has no problems moving his bowels. He has had occasional nausea but no vomiting. He has had an extensive workup for the symptoms. The discomfort can be moderate to severe.  Past Medical History   Diagnosis  Date   .  Hyperlipidemia      Hx of statin intolerance+ LFT elevations   .  Pneumothorax      Spontaneous   .  Hypertension    .  Hepatic steatosis  2012 u/s   .  Cyst of left kidney  2012 u/s     ? complex (f/u renal u/s done to document stability?)   .  Nephrolithiasis  2000   .  GERD (gastroesophageal reflux disease)     Past Surgical History   Procedure  Laterality  Date   .  Appendectomy     .  Hernia repair   1995; 2000     Right spigelian hernia. Umbill hern   .  Colonoscopy   2013     Adenomatous polyp; recall 5 yrs (Dr. Loreta AveMann)   .  Esophagogastroduodenoscopy      Family History   Problem  Relation  Age of Onset   .  Coronary artery disease     .  Heart disease  Mother    .  Early death  Father      cad   .  Heart disease  Father    Social History  History   Substance Use Topics   .  Smoking status:  Never Smoker   .  Smokeless tobacco:  Never Used   .  Alcohol Use:  No   No Known Allergies  Current Outpatient Prescriptions   Medication  Sig  Dispense  Refill   .  LEVITRA 20 MG tablet  Take 20 mg by mouth daily as needed.     Marland Kitchen.  NASONEX 50 MCG/ACT nasal spray  Place 2 sprays into the nose daily.     Marland Kitchen.  olmesartan-hydrochlorothiazide (BENICAR HCT) 20-12.5 MG per  tablet  Take 0.5 tablets by mouth daily.  45 tablet  1   .  RABEprazole (ACIPHEX) 20 MG tablet  Take 1 tablet (20 mg total) by mouth daily.  90 tablet  1   .  zolpidem (AMBIEN) 10 MG tablet  Take 1 tablet by mouth Daily.     Marland Kitchen.  aspirin 81 MG tablet  Take 81 mg by mouth daily.      No current facility-administered medications for this visit.   Review of Systems  Review of Systems  Constitutional: Negative for fever, chills and unexpected weight change.  HENT: Negative for congestion, hearing loss, sore throat, trouble swallowing and voice change.  Eyes: Negative for visual disturbance.  Respiratory: Negative for cough and wheezing.  Cardiovascular: Negative for chest pain, palpitations and leg swelling.  Gastrointestinal: Positive for nausea, abdominal pain and abdominal distention. Negative for vomiting, diarrhea, constipation,  blood in stool, anal bleeding and rectal pain.  Genitourinary: Negative for hematuria and difficulty urinating.  Musculoskeletal: Negative for arthralgias.  Skin: Negative for rash and wound.  Neurological: Negative for seizures, syncope, weakness and headaches.  Hematological: Negative for adenopathy. Does not bruise/bleed easily.  Psychiatric/Behavioral: Negative for confusion.  Blood pressure 110/84, pulse 88, temperature 98.4 F (36.9 C), temperature source Temporal, resp. rate 15, height 6\' 2"  (1.88 m), weight 210 lb 12.8 oz (95.618 kg).  Physical Exam  Physical Exam  Constitutional: He is oriented to person, place, and time. He appears well-developed and well-nourished. No distress.  HENT:  Head: Normocephalic and atraumatic.  Right Ear: External ear normal.  Left Ear: External ear normal.  Nose: Nose normal.  Mouth/Throat: Oropharynx is clear and moist. No oropharyngeal exudate.  Eyes: Conjunctivae are normal. Pupils are equal, round, and reactive to light. Right eye exhibits no discharge. Left eye exhibits no discharge. No scleral icterus.  Neck: Normal  range of motion. Neck supple. No tracheal deviation present.  Cardiovascular: Normal rate, normal heart sounds and intact distal pulses.  No murmur heard.  Pulmonary/Chest: Effort normal and breath sounds normal. No respiratory distress. He has no wheezes. He has no rales.  Abdominal: Soft. There is tenderness. There is guarding.  There is mild epigastric tenderness with guarding also in the right upper quadrant  Musculoskeletal: Normal range of motion.  Lymphadenopathy:  He has no cervical adenopathy.  Neurological: He is alert and oriented to person, place, and time.  Skin: Skin is warm. No rash noted. He is not diaphoretic. No erythema.  Psychiatric: His behavior is normal. Judgment normal.   Data Reviewed  He has a hida scan showing a gallbladder ejection fraction of 31%. His symptoms were reproduced with fatty ingestion during the test.   Assessment  Biliary dyskinesia   Plan  I think the gallbladder is the source of his discomfort as his other workup have been unremarkable. I suspect he does have chronic cholecystitis. I discussed laparoscopic cholecystectomy with him in detail. I discussed the risks of surgery which includes but is not limited to bleeding, infection, injury to surrounding structures, bile leak, the need to convert to an open procedure, and the chance this may not resolve his symptoms. I also discussed postoperative recovery. He discussed this with his wife and wishes to proceed with laparoscopic cholecystectomy. Surgery is scheduled

## 2013-11-08 ENCOUNTER — Ambulatory Visit (HOSPITAL_COMMUNITY)
Admission: RE | Admit: 2013-11-08 | Discharge: 2013-11-08 | Disposition: A | Discharge status: Home / Self Care | Payer: 59 | Source: Ambulatory Visit | Attending: Surgery | Admitting: Surgery

## 2013-11-08 ENCOUNTER — Encounter (HOSPITAL_COMMUNITY): Payer: 59 | Admitting: Anesthesiology

## 2013-11-08 ENCOUNTER — Encounter (HOSPITAL_COMMUNITY): Payer: Self-pay | Admitting: *Deleted

## 2013-11-08 ENCOUNTER — Encounter (HOSPITAL_COMMUNITY)
Admission: RE | Disposition: A | Discharge status: Home / Self Care | Payer: Self-pay | Source: Ambulatory Visit | Attending: Surgery

## 2013-11-08 ENCOUNTER — Ambulatory Visit (HOSPITAL_COMMUNITY): Payer: 59 | Admitting: Anesthesiology

## 2013-11-08 DIAGNOSIS — K810 Acute cholecystitis: Secondary | ICD-10-CM

## 2013-11-08 DIAGNOSIS — Q619 Cystic kidney disease, unspecified: Secondary | ICD-10-CM | POA: Insufficient documentation

## 2013-11-08 DIAGNOSIS — K219 Gastro-esophageal reflux disease without esophagitis: Secondary | ICD-10-CM | POA: Insufficient documentation

## 2013-11-08 DIAGNOSIS — K811 Chronic cholecystitis: Secondary | ICD-10-CM | POA: Insufficient documentation

## 2013-11-08 DIAGNOSIS — I10 Essential (primary) hypertension: Secondary | ICD-10-CM | POA: Insufficient documentation

## 2013-11-08 DIAGNOSIS — Z79899 Other long term (current) drug therapy: Secondary | ICD-10-CM | POA: Insufficient documentation

## 2013-11-08 DIAGNOSIS — Z7982 Long term (current) use of aspirin: Secondary | ICD-10-CM | POA: Insufficient documentation

## 2013-11-08 DIAGNOSIS — Z9089 Acquired absence of other organs: Secondary | ICD-10-CM | POA: Insufficient documentation

## 2013-11-08 DIAGNOSIS — R110 Nausea: Secondary | ICD-10-CM | POA: Insufficient documentation

## 2013-11-08 DIAGNOSIS — E785 Hyperlipidemia, unspecified: Secondary | ICD-10-CM | POA: Insufficient documentation

## 2013-11-08 HISTORY — PX: CHOLECYSTECTOMY: SHX55

## 2013-11-08 SURGERY — LAPAROSCOPIC CHOLECYSTECTOMY
Anesthesia: General | Site: Abdomen

## 2013-11-08 MED ORDER — NEOSTIGMINE METHYLSULFATE 1 MG/ML IJ SOLN
INTRAMUSCULAR | Status: DC | PRN
  Administered 2013-11-08: 5 mg via INTRAVENOUS

## 2013-11-08 MED ORDER — DEXAMETHASONE SODIUM PHOSPHATE 10 MG/ML IJ SOLN
INTRAMUSCULAR | Status: DC | PRN
  Administered 2013-11-08: 10 mg via INTRAVENOUS

## 2013-11-08 MED ORDER — MIDAZOLAM HCL 5 MG/5ML IJ SOLN
INTRAMUSCULAR | Status: DC | PRN
  Administered 2013-11-08: 2 mg via INTRAVENOUS

## 2013-11-08 MED ORDER — SUCCINYLCHOLINE CHLORIDE 20 MG/ML IJ SOLN
INTRAMUSCULAR | Status: DC | PRN
  Administered 2013-11-08: 100 mg via INTRAVENOUS

## 2013-11-08 MED ORDER — LIDOCAINE HCL (CARDIAC) 20 MG/ML IV SOLN
INTRAVENOUS | Status: DC | PRN
  Administered 2013-11-08: 80 mg via INTRAVENOUS

## 2013-11-08 MED ORDER — KETOROLAC TROMETHAMINE 30 MG/ML IJ SOLN
INTRAMUSCULAR | Status: AC
  Filled 2013-11-08: qty 1

## 2013-11-08 MED ORDER — ONDANSETRON HCL 4 MG/2ML IJ SOLN
INTRAMUSCULAR | Status: AC
  Filled 2013-11-08: qty 2

## 2013-11-08 MED ORDER — FENTANYL CITRATE 0.05 MG/ML IJ SOLN
INTRAMUSCULAR | Status: DC | PRN
  Administered 2013-11-08 (×2): 50 ug via INTRAVENOUS

## 2013-11-08 MED ORDER — HYDROMORPHONE HCL PF 1 MG/ML IJ SOLN
INTRAMUSCULAR | Status: AC
  Filled 2013-11-08: qty 1

## 2013-11-08 MED ORDER — MIDAZOLAM HCL 2 MG/2ML IJ SOLN
INTRAMUSCULAR | Status: AC
  Filled 2013-11-08: qty 2

## 2013-11-08 MED ORDER — BUPIVACAINE HCL (PF) 0.5 % IJ SOLN
INTRAMUSCULAR | Status: AC
  Filled 2013-11-08: qty 30

## 2013-11-08 MED ORDER — ONDANSETRON HCL 4 MG/2ML IJ SOLN: 4.0000 mg | Freq: Four times a day (QID) | INTRAMUSCULAR | Status: DC | PRN

## 2013-11-08 MED ORDER — ACETAMINOPHEN 650 MG RE SUPP
650.0000 mg | RECTAL | Status: DC | PRN
  Filled 2013-11-08: qty 1

## 2013-11-08 MED ORDER — EPHEDRINE SULFATE 50 MG/ML IJ SOLN
INTRAMUSCULAR | Status: DC | PRN
  Administered 2013-11-08: 10 mg via INTRAVENOUS
  Administered 2013-11-08: 5 mg via INTRAVENOUS

## 2013-11-08 MED ORDER — BUPIVACAINE HCL (PF) 0.5 % IJ SOLN
INTRAMUSCULAR | Status: DC | PRN
  Administered 2013-11-08: 18 mL

## 2013-11-08 MED ORDER — LIDOCAINE HCL (CARDIAC) 20 MG/ML IV SOLN
INTRAVENOUS | Status: AC
  Filled 2013-11-08: qty 5

## 2013-11-08 MED ORDER — KETOROLAC TROMETHAMINE 30 MG/ML IJ SOLN: 15.0000 mg | Freq: Once | INTRAMUSCULAR | Status: DC | PRN

## 2013-11-08 MED ORDER — ROCURONIUM BROMIDE 100 MG/10ML IV SOLN
INTRAVENOUS | Status: DC | PRN
  Administered 2013-11-08: 30 mg via INTRAVENOUS

## 2013-11-08 MED ORDER — CEFAZOLIN SODIUM-DEXTROSE 2-3 GM-% IV SOLR
INTRAVENOUS | Status: AC
  Filled 2013-11-08: qty 50

## 2013-11-08 MED ORDER — OXYCODONE HCL 5 MG PO TABS: 5.0000 mg | ORAL_TABLET | ORAL | Status: DC | PRN

## 2013-11-08 MED ORDER — LACTATED RINGERS IV SOLN
INTRAVENOUS | Status: DC
  Administered 2013-11-08: 1000 mL via INTRAVENOUS

## 2013-11-08 MED ORDER — KETOROLAC TROMETHAMINE 30 MG/ML IJ SOLN
INTRAMUSCULAR | Status: DC | PRN
  Administered 2013-11-08: 30 mg via INTRAVENOUS

## 2013-11-08 MED ORDER — MORPHINE SULFATE 10 MG/ML IJ SOLN: 4.0000 mg | INTRAMUSCULAR | Status: DC | PRN

## 2013-11-08 MED ORDER — LACTATED RINGERS IV SOLN
INTRAVENOUS | Status: DC | PRN
  Administered 2013-11-08: 1000 mL via INTRAVENOUS

## 2013-11-08 MED ORDER — GLYCOPYRROLATE 0.2 MG/ML IJ SOLN
INTRAMUSCULAR | Status: AC
  Filled 2013-11-08: qty 4

## 2013-11-08 MED ORDER — PROPOFOL 10 MG/ML IV BOLUS
INTRAVENOUS | Status: DC | PRN
  Administered 2013-11-08: 200 mg via INTRAVENOUS

## 2013-11-08 MED ORDER — PROPOFOL 10 MG/ML IV BOLUS
INTRAVENOUS | Status: AC
  Filled 2013-11-08: qty 20

## 2013-11-08 MED ORDER — HYDROCODONE-ACETAMINOPHEN 5-325 MG PO TABS: 1.0000 | ORAL_TABLET | ORAL | Status: DC | PRN

## 2013-11-08 MED ORDER — PROMETHAZINE HCL 25 MG/ML IJ SOLN
6.2500 mg | INTRAMUSCULAR | Status: DC | PRN
  Administered 2013-11-08: 6.25 mg via INTRAVENOUS
  Filled 2013-11-08: qty 1

## 2013-11-08 MED ORDER — METOCLOPRAMIDE HCL 5 MG/ML IJ SOLN
INTRAMUSCULAR | Status: DC | PRN
  Administered 2013-11-08: 10 mg via INTRAVENOUS

## 2013-11-08 MED ORDER — ONDANSETRON HCL 4 MG/2ML IJ SOLN
INTRAMUSCULAR | Status: DC | PRN
  Administered 2013-11-08: 4 mg via INTRAVENOUS

## 2013-11-08 MED ORDER — CEFAZOLIN SODIUM-DEXTROSE 2-3 GM-% IV SOLR
2.0000 g | INTRAVENOUS | Status: AC
  Administered 2013-11-08: 2 g via INTRAVENOUS

## 2013-11-08 MED ORDER — ACETAMINOPHEN 325 MG PO TABS: 650.0000 mg | ORAL_TABLET | ORAL | Status: DC | PRN

## 2013-11-08 MED ORDER — HYDROMORPHONE HCL PF 1 MG/ML IJ SOLN
0.2500 mg | INTRAMUSCULAR | Status: DC | PRN
  Administered 2013-11-08 (×2): 0.5 mg via INTRAVENOUS

## 2013-11-08 MED ORDER — SODIUM CHLORIDE 0.9 % IJ SOLN: 3.0000 mL | Freq: Two times a day (BID) | INTRAMUSCULAR | Status: DC

## 2013-11-08 MED ORDER — METOCLOPRAMIDE HCL 5 MG/ML IJ SOLN
INTRAMUSCULAR | Status: AC
  Filled 2013-11-08: qty 2

## 2013-11-08 MED ORDER — FENTANYL CITRATE 0.05 MG/ML IJ SOLN
INTRAMUSCULAR | Status: AC
  Filled 2013-11-08: qty 5

## 2013-11-08 MED ORDER — GLYCOPYRROLATE 0.2 MG/ML IJ SOLN
INTRAMUSCULAR | Status: DC | PRN
  Administered 2013-11-08: 0.2 mg via INTRAVENOUS
  Administered 2013-11-08: .8 mg via INTRAVENOUS

## 2013-11-08 MED ORDER — 0.9 % SODIUM CHLORIDE (POUR BTL) OPTIME
TOPICAL | Status: DC | PRN
  Administered 2013-11-08: 1000 mL

## 2013-11-08 MED ORDER — SODIUM CHLORIDE 0.9 % IV SOLN: 250.0000 mL | INTRAVENOUS | Status: DC | PRN

## 2013-11-08 MED ORDER — DEXAMETHASONE SODIUM PHOSPHATE 10 MG/ML IJ SOLN
INTRAMUSCULAR | Status: AC
  Filled 2013-11-08: qty 1

## 2013-11-08 MED ORDER — SODIUM CHLORIDE 0.9 % IJ SOLN: 3.0000 mL | INTRAMUSCULAR | Status: DC | PRN

## 2013-11-08 SURGICAL SUPPLY — 35 items
APL SKNCLS STERI-STRIP NONHPOA (GAUZE/BANDAGES/DRESSINGS) ×1
APPLIER CLIP 5 13 M/L LIGAMAX5 (MISCELLANEOUS) ×3
APR CLP MED LRG 5 ANG JAW (MISCELLANEOUS) ×1
BAG SPEC RTRVL LRG 6X4 10 (ENDOMECHANICALS) ×1
BANDAGE ADH SHEER 1  50/CT (GAUZE/BANDAGES/DRESSINGS) ×12 IMPLANT
BENZOIN TINCTURE PRP APPL 2/3 (GAUZE/BANDAGES/DRESSINGS) ×3 IMPLANT
CANISTER SUCTION 2500CC (MISCELLANEOUS) ×1 IMPLANT
CHLORAPREP W/TINT 26ML (MISCELLANEOUS) ×3 IMPLANT
CLIP APPLIE 5 13 M/L LIGAMAX5 (MISCELLANEOUS) ×1 IMPLANT
CLOSURE WOUND 1/2 X4 (GAUZE/BANDAGES/DRESSINGS) ×1
COVER MAYO STAND STRL (DRAPES) IMPLANT
DECANTER SPIKE VIAL GLASS SM (MISCELLANEOUS) ×1 IMPLANT
DRAPE C-ARM 42X120 X-RAY (DRAPES) IMPLANT
DRAPE LAPAROSCOPIC ABDOMINAL (DRAPES) ×3 IMPLANT
DRAPE UTILITY XL STRL (DRAPES) ×3 IMPLANT
ELECT REM PT RETURN 9FT ADLT (ELECTROSURGICAL) ×3
ELECTRODE REM PT RTRN 9FT ADLT (ELECTROSURGICAL) ×1 IMPLANT
GLOVE SURG SIGNA 7.5 PF LTX (GLOVE) ×3 IMPLANT
GOWN STRL REUS W/TWL XL LVL3 (GOWN DISPOSABLE) ×10 IMPLANT
HEMOSTAT SURGICEL 4X8 (HEMOSTASIS) IMPLANT
KIT BASIN OR (CUSTOM PROCEDURE TRAY) ×3 IMPLANT
MANIFOLD NEPTUNE II (INSTRUMENTS) ×2 IMPLANT
POUCH SPECIMEN RETRIEVAL 10MM (ENDOMECHANICALS) ×3 IMPLANT
SET CHOLANGIOGRAPH MIX (MISCELLANEOUS) IMPLANT
SET IRRIG TUBING LAPAROSCOPIC (IRRIGATION / IRRIGATOR) ×3 IMPLANT
SOLUTION ANTI FOG 6CC (MISCELLANEOUS) ×3 IMPLANT
STRIP CLOSURE SKIN 1/2X4 (GAUZE/BANDAGES/DRESSINGS) ×2 IMPLANT
SUT MNCRL AB 4-0 PS2 18 (SUTURE) ×3 IMPLANT
TOWEL OR 17X26 10 PK STRL BLUE (TOWEL DISPOSABLE) ×3 IMPLANT
TOWEL OR NON WOVEN STRL DISP B (DISPOSABLE) ×3 IMPLANT
TRAY LAP CHOLE (CUSTOM PROCEDURE TRAY) ×3 IMPLANT
TROCAR BLADELESS OPT 5 75 (ENDOMECHANICALS) ×3 IMPLANT
TROCAR SLEEVE XCEL 5X75 (ENDOMECHANICALS) ×6 IMPLANT
TROCAR XCEL BLUNT TIP 100MML (ENDOMECHANICALS) ×3 IMPLANT
TUBING INSUFFLATION 10FT LAP (TUBING) ×3 IMPLANT

## 2013-11-08 NOTE — Progress Notes (Signed)
Patient ambulated in hallway. Tolerated well. Unable to void. Back to room and up in recliner. Complaint of nausea without vomiting. Antiemetic given.

## 2013-11-08 NOTE — Anesthesia Postprocedure Evaluation (Signed)
  Anesthesia Post-op Note  Patient: Donald [name]  Procedure(s) Performed: Procedure(s) (LRB): LAPAROSCOPIC CHOLECYSTECTOMY  (N/A)  Patient Location: PACU  Anesthesia Type: General  Level of Consciousness: awake and alert   Airway and Oxygen Therapy: Patient Spontanous Breathing  Post-op Pain: mild  Post-op Assessment: Post-op Vital signs reviewed, Patient's Cardiovascular Status Stable, Respiratory Function Stable, Patent Airway and No signs of Nausea or vomiting  Last Vitals:  Filed Vitals:   11/08/13 1145  BP: 132/70  Pulse: 61  Temp:   Resp: 10    Post-op Vital Signs: stable   Complications: No apparent anesthesia complications

## 2013-11-08 NOTE — Transfer of Care (Signed)
Immediate Anesthesia Transfer of Care Note  Patient: Donald [name]  Procedure(s) Performed: Procedure(s): LAPAROSCOPIC CHOLECYSTECTOMY  (N/A)  Patient Location: PACU  Anesthesia Type:General  Level of Consciousness: awake, alert , oriented and patient cooperative  Airway & Oxygen Therapy: Patient Spontanous Breathing and Patient connected to face mask oxygen  Post-op Assessment: Report given to PACU RN, Post -op Vital signs reviewed and stable and Patient moving all extremities  Post vital signs: Reviewed and stable  Complications: No apparent anesthesia complications

## 2013-11-08 NOTE — Anesthesia Preprocedure Evaluation (Addendum)
Anesthesia Evaluation  Patient identified by MRN, date of birth, ID band Patient awake    Reviewed: Allergy & Precautions, H&P , NPO status , Patient's Chart, lab work & pertinent test results  Airway Mallampati: II  TM Distance: >3 FB Neck ROM: Full    Dental no notable dental hx.    Pulmonary neg pulmonary ROS,  breath sounds clear to auscultation  Pulmonary exam normal       Cardiovascular hypertension, Pt. on medications Rhythm:Regular Rate:Normal     Neuro/Psych negative neurological ROS  negative psych ROS   GI/Hepatic negative GI ROS, Neg liver ROS,   Endo/Other  negative endocrine ROS  Renal/GU negative Renal ROS  negative genitourinary   Musculoskeletal negative musculoskeletal ROS (+)   Abdominal   Peds negative pediatric ROS (+)  Hematology negative hematology ROS (+)   Anesthesia Other Findings   Reproductive/Obstetrics negative OB ROS                            Anesthesia Physical Anesthesia Plan  ASA: II  Anesthesia Plan: General   Post-op Pain Management:    Induction: Intravenous  Airway Management Planned: Oral ETT  Additional Equipment:   Intra-op Plan:   Post-operative Plan: Extubation in OR  Informed Consent: I have reviewed the patients History and Physical, chart, labs and discussed the procedure including the risks, benefits and alternatives for the proposed anesthesia with the patient or authorized representative who has indicated his/her understanding and acceptance.   Dental advisory given  Plan Discussed with: CRNA and Surgeon  Anesthesia Plan Comments:        Anesthesia Quick Evaluation  

## 2013-11-08 NOTE — Interval H&P Note (Signed)
History and Physical Interval Note: no change in H and P  11/08/2013 9:06 AM  [name]  has presented today for surgery, with the diagnosis of bilinary dyskensia  The various methods of treatment have been discussed with the patient and family. After consideration of risks, benefits and other options for treatment, the patient has consented to  Procedure(s): LAPAROSCOPIC CHOLECYSTECTOMY (N/A) as a surgical intervention .  The patient's history has been reviewed, patient examined, no change in status, stable for surgery.  I have reviewed the patient's chart and labs.  Questions were answered to the patient's satisfaction.     Sherida Dobkins A

## 2013-11-08 NOTE — Preoperative (Signed)
Beta Blockers   Reason not to administer Beta Blockers:Not Applicable 

## 2013-11-08 NOTE — Discharge Instructions (Signed)
[facility] ______[facility] LAPAROSCOPIC SURGERY: POST OP INSTRUCTIONS Always review your discharge instruction sheet given to you by the facility where your surgery was performed. IF YOU HAVE DISABILITY OR FAMILY LEAVE FORMS, YOU MUST BRING THEM TO THE OFFICE FOR PROCESSING.   DO NOT GIVE THEM TO YOUR DOCTOR.  1. A prescription for pain medication may be given to you upon discharge.  Take your pain medication as prescribed, if needed.  If narcotic pain medicine is not needed, then you may take acetaminophen (Tylenol) or ibuprofen (Advil) as needed. 2. Take your usually prescribed medications unless otherwise directed. 3. If you need a refill on your pain medication, please contact your pharmacy.  They will contact our office to request authorization. Prescriptions will not be filled after 5pm or on week-ends. 4. You should follow a light diet the first few days after arrival home, such as soup and crackers, etc.  Be sure to include lots of fluids daily. 5. Most patients will experience some swelling and bruising in the area of the incisions.  Ice packs will help.  Swelling and bruising can take several days to resolve.  6. It is common to experience some constipation if taking pain medication after surgery.  Increasing fluid intake and taking a stool softener (such as Colace) will usually help or prevent this problem from occurring.  A mild laxative (Milk of Magnesia or Miralax) should be taken according to package instructions if there are no bowel movements after 48 hours. 7. Unless discharge instructions indicate otherwise, you may remove your bandages 24-48 hours after surgery, and you may shower at that time.  You may have steri-strips (small skin tapes) in place directly over the incision.  These strips should be left on the skin for 7-10 days.  If your surgeon used skin glue on the incision, you may shower in 24 hours.  The glue will flake off over the next 2-3 weeks.  Any sutures or  staples will be removed at the office during your follow-up visit. 8. ACTIVITIES:  You may resume regular (light) daily activities beginning the next day--such as daily self-care, walking, climbing stairs--gradually increasing activities as tolerated.  You may have sexual intercourse when it is comfortable.  Refrain from any heavy lifting or straining until approved by your doctor. a. You may drive when you are no longer taking prescription pain medication, you can comfortably wear a seatbelt, and you can safely maneuver your car and apply brakes. b. RETURN TO WORK:  __________________________________________________________ 9. You should see your doctor in the office for a follow-up appointment approximately 2-3 weeks after your surgery.  Make sure that you call for this appointment within a day or two after you arrive home to insure a convenient appointment time. 10. OTHER INSTRUCTIONS: ____NO LIFTING MORE THAN 15 TO 20  POUNDS FOR 2 WEEKS 11. ICE PACK AND IBUPROFEN ALSO FOR PAIN 12. STOOL SOFTENER FOR CONSTIPATION______________________________________________________________________________________________________________________ __________________________________________________________________________________________________________________________ WHEN TO CALL YOUR DOCTOR: 1. Fever over 101.0 2. Inability to urinate 3. Continued bleeding from incision. 4. Increased pain, redness, or drainage from the incision. 5. Increasing abdominal pain  The clinic staff is available to answer your questions during regular business hours.  Please dont hesitate to call and ask to speak to one of the nurses for clinical concerns.  If you have a medical emergency, go to the nearest emergency room or call 911.  A surgeon from [facility] is always on call at the hospital. [address], [address], [place], [place]  [address] ? [address] [address], [place], [place]   [address] °[phone] ?  [phone] ? FAX [phone] °Web site: [email or web address] °

## 2013-11-08 NOTE — Op Note (Signed)
Laparoscopic Cholecystectomy Procedure Note  Indications: This patient presents with symptomatic gallbladder disease and will undergo laparoscopic cholecystectomy.  Pre-operative Diagnosis: biliary dyskinesia  Post-operative Diagnosis: Same  Surgeon: Abigail MiyamotoBLACKMAN,Bennett Vanscyoc A   Assistants: 0  Anesthesia: General endotracheal anesthesia  ASA Class: 1, 2  Procedure Details  The patient was seen again in the Holding Room. The risks, benefits, complications, treatment options, and expected outcomes were discussed with the patient. The possibilities of reaction to medication, pulmonary aspiration, perforation of viscus, bleeding, recurrent infection, finding a normal gallbladder, the need for additional procedures, failure to diagnose a condition, the possible need to convert to an open procedure, and creating a complication requiring transfusion or operation were discussed with the patient. The likelihood of improving the patient's symptoms with return to their baseline status is good.  The patient and/or family concurred with the proposed plan, giving informed consent. The site of surgery properly noted. The patient was taken to Operating Room, identified as Donald [name] and the procedure verified as Laparoscopic Cholecystectomy with Intraoperative Cholangiogram. A Time Out was held and the above information confirmed.  Prior to the induction of general anesthesia, antibiotic prophylaxis was administered. General endotracheal anesthesia was then administered and tolerated well. After the induction, the abdomen was prepped with Chloraprep and draped in sterile fashion. The patient was positioned in the supine position.  Local anesthetic agent was injected into the skin near the umbilicus and an incision made. We dissected down to the abdominal fascia with blunt dissection.  The fascia was incised vertically and we entered the peritoneal cavity bluntly.  A pursestring suture of 0-Vicryl was placed  around the fascial opening.  The Hasson cannula was inserted and secured with the stay suture.  Pneumoperitoneum was then created with CO2 and tolerated well without any adverse changes in the patient's vital signs. An 11-mm port was placed in the subxiphoid position.  Two 5-mm ports were placed in the right upper quadrant. All skin incisions were infiltrated with a local anesthetic agent before making the incision and placing the trocars.   We positioned the patient in reverse Trendelenburg, tilted slightly to the patient's left.  The gallbladder was identified, the fundus grasped and retracted cephalad. Adhesions were lysed bluntly and with the electrocautery where indicated, taking care not to injure any adjacent organs or viscus. The infundibulum was grasped and retracted laterally, exposing the peritoneum overlying the triangle of Calot. This was then divided and exposed in a blunt fashion. The cystic duct was clearly identified and bluntly dissected circumferentially. A critical view of the cystic duct and cystic artery was obtained.  The cystic duct was then ligated with clips and divided. The cystic artery was, dissected free, ligated with clips and divided as well.   The gallbladder was dissected from the liver bed in retrograde fashion with the electrocautery. The gallbladder was removed and placed in an Endocatch sac. The liver bed was irrigated and inspected. Hemostasis was achieved with the electrocautery. Copious irrigation was utilized and was repeatedly aspirated until clear.  The gallbladder and Endocatch sac were then removed through the umbilical port site.  The pursestring suture was used to close the umbilical fascia.  I also reinforced the fascia with a figure of eight 0 prolene suture  We again inspected the right upper quadrant for hemostasis.  Pneumoperitoneum was released as we removed the trocars.  4-0 Monocryl was used to close the skin.   Benzoin, steri-strips, and clean dressings  were applied. The patient was then extubated  and brought to the recovery room in stable condition. Instrument, sponge, and needle counts were correct at closure and at the conclusion of the case.   Findings: Cholecystitis without Cholelithiasis  Estimated Blood Loss: Minimal         Drains: 0         Specimens: Gallbladder           Complications: None; patient tolerated the procedure well.         Disposition: PACU - hemodynamically stable.         Condition: stable

## 2013-11-09 ENCOUNTER — Encounter (HOSPITAL_COMMUNITY): Payer: Self-pay | Admitting: Surgery

## 2013-11-10 ENCOUNTER — Ambulatory Visit (INDEPENDENT_AMBULATORY_CARE_PROVIDER_SITE_OTHER): Payer: 59 | Admitting: Surgery

## 2013-11-11 ENCOUNTER — Telehealth (INDEPENDENT_AMBULATORY_CARE_PROVIDER_SITE_OTHER): Payer: Self-pay | Admitting: General Surgery

## 2013-11-11 NOTE — Telephone Encounter (Signed)
Patient called in explaining that he had a lap chole on 11/08/13 and he was under the impression that although the incision would be sore he didn't realize the pain would be deeper than the incision site at the skin.  He explained that when he walks he feels a pulling and painful sensation under the incision.  I explained to him that this is still to be expected this shortly after surgery and that our best advise is that if it hurts you shouldn't do it.  Informed him he might need to take it a little easier for the next couple days.  He explained he understood.  Patient denies any redness, drainage, or swelling at the incision site.

## 2013-11-14 ENCOUNTER — Telehealth (INDEPENDENT_AMBULATORY_CARE_PROVIDER_SITE_OTHER): Payer: Self-pay | Admitting: *Deleted

## 2013-11-14 ENCOUNTER — Encounter (HOSPITAL_COMMUNITY): Payer: Self-pay | Admitting: Emergency Medicine

## 2013-11-14 ENCOUNTER — Emergency Department (HOSPITAL_COMMUNITY)
Admission: EM | Admit: 2013-11-14 | Discharge: 2013-11-14 | Disposition: A | Discharge status: Home / Self Care | Payer: 59 | Attending: Emergency Medicine | Admitting: Emergency Medicine

## 2013-11-14 DIAGNOSIS — Z8669 Personal history of other diseases of the nervous system and sense organs: Secondary | ICD-10-CM | POA: Insufficient documentation

## 2013-11-14 DIAGNOSIS — Z7982 Long term (current) use of aspirin: Secondary | ICD-10-CM | POA: Insufficient documentation

## 2013-11-14 DIAGNOSIS — Z87442 Personal history of urinary calculi: Secondary | ICD-10-CM | POA: Insufficient documentation

## 2013-11-14 DIAGNOSIS — R5381 Other malaise: Secondary | ICD-10-CM | POA: Insufficient documentation

## 2013-11-14 DIAGNOSIS — R42 Dizziness and giddiness: Secondary | ICD-10-CM | POA: Insufficient documentation

## 2013-11-14 DIAGNOSIS — K219 Gastro-esophageal reflux disease without esophagitis: Secondary | ICD-10-CM | POA: Insufficient documentation

## 2013-11-14 DIAGNOSIS — Z8709 Personal history of other diseases of the respiratory system: Secondary | ICD-10-CM | POA: Insufficient documentation

## 2013-11-14 DIAGNOSIS — R531 Weakness: Secondary | ICD-10-CM

## 2013-11-14 DIAGNOSIS — I10 Essential (primary) hypertension: Secondary | ICD-10-CM | POA: Insufficient documentation

## 2013-11-14 DIAGNOSIS — R5383 Other fatigue: Principal | ICD-10-CM

## 2013-11-14 DIAGNOSIS — Z87448 Personal history of other diseases of urinary system: Secondary | ICD-10-CM | POA: Insufficient documentation

## 2013-11-14 DIAGNOSIS — Z79899 Other long term (current) drug therapy: Secondary | ICD-10-CM | POA: Insufficient documentation

## 2013-11-14 LAB — COMPREHENSIVE METABOLIC PANEL
ALT: 74 U/L — AB (ref 0–53)
AST: 31 U/L (ref 0–37)
Albumin: 4.4 g/dL (ref 3.5–5.2)
Alkaline Phosphatase: 83 U/L (ref 39–117)
BUN: 19 mg/dL (ref 6–23)
CO2: 29 meq/L (ref 19–32)
Calcium: 9.7 mg/dL (ref 8.4–10.5)
Chloride: 98 mEq/L (ref 96–112)
Creatinine, Ser: 1.38 mg/dL — ABNORMAL HIGH (ref 0.50–1.35)
GFR, EST AFRICAN AMERICAN: 66 mL/min — AB (ref >90)
GFR, EST NON AFRICAN AMERICAN: 57 mL/min — AB (ref >90)
Glucose, Bld: 102 mg/dL — ABNORMAL HIGH (ref 70–99)
Potassium: 4.7 mEq/L (ref 3.7–5.3)
SODIUM: 138 meq/L (ref 137–147)
Total Bilirubin: 0.7 mg/dL (ref 0.3–1.2)
Total Protein: 7.7 g/dL (ref 6.0–8.3)

## 2013-11-14 LAB — URINALYSIS, ROUTINE W REFLEX MICROSCOPIC
BILIRUBIN URINE: NEGATIVE
Glucose, UA: NEGATIVE mg/dL
Hgb urine dipstick: NEGATIVE
KETONES UR: NEGATIVE mg/dL
LEUKOCYTES UA: NEGATIVE
NITRITE: NEGATIVE
Protein, ur: NEGATIVE mg/dL
SPECIFIC GRAVITY, URINE: 1.022 (ref 1.005–1.030)
UROBILINOGEN UA: 0.2 mg/dL (ref 0.0–1.0)
pH: 5 (ref 5.0–8.0)

## 2013-11-14 LAB — CBC WITH DIFFERENTIAL/PLATELET
Basophils Absolute: 0.1 10*3/uL (ref 0.0–0.1)
Basophils Relative: 1 % (ref 0–1)
Eosinophils Absolute: 0.2 10*3/uL (ref 0.0–0.7)
Eosinophils Relative: 2 % (ref 0–5)
HCT: 46 % (ref 39.0–52.0)
Hemoglobin: 16.4 g/dL (ref 13.0–17.0)
LYMPHS PCT: 28 % (ref 12–46)
Lymphs Abs: 2.5 10*3/uL (ref 0.7–4.0)
MCH: 32.1 pg (ref 26.0–34.0)
MCHC: 35.7 g/dL (ref 30.0–36.0)
MCV: 90 fL (ref 78.0–100.0)
MONO ABS: 0.9 10*3/uL (ref 0.1–1.0)
Monocytes Relative: 10 % (ref 3–12)
Neutro Abs: 5.1 10*3/uL (ref 1.7–7.7)
Neutrophils Relative %: 59 % (ref 43–77)
PLATELETS: 203 10*3/uL (ref 150–400)
RBC: 5.11 MIL/uL (ref 4.22–5.81)
RDW: 12.5 % (ref 11.5–15.5)
WBC: 8.7 10*3/uL (ref 4.0–10.5)

## 2013-11-14 MED ORDER — SODIUM CHLORIDE 0.9 % IV SOLN
1000.0000 mL | Freq: Once | INTRAVENOUS | Status: AC
  Administered 2013-11-14: 1000 mL via INTRAVENOUS

## 2013-11-14 NOTE — ED Notes (Signed)
Pt alert and oriented x4. Respirations even and unlabored, bilateral symmetrical rise and fall of chest. Skin warm and dry. In no acute distress. Denies needs.   

## 2013-11-14 NOTE — ED Notes (Signed)
Per pt, had gallbladder out last Monday-has not been able to eat/drinks a lot-went back to work today-dizzy, weak-hypotensive

## 2013-11-14 NOTE — ED Notes (Signed)
Pt escorted to discharge window. Pt verbalized understanding discharge instructions. In no acute distress.  

## 2013-11-14 NOTE — ED Notes (Signed)
Bed: WA18 Expected date:  Expected time:  Means of arrival:  Comments: Triage 1 

## 2013-11-14 NOTE — ED Provider Notes (Signed)
CSN: [id number]     Arrival date & time 11/14/13  1119 History   First MD Initiated Contact with Patient 11/14/13 1403     Chief Complaint  Patient presents with  . Hypotension     (Consider location/radiation/quality/duration/timing/severity/associated sxs/prior Treatment) HPI  Patient presents with concerns of weakness dizziness and hypertension. Patient has a notable history of cholecystectomy one week ago. He notes that since that time he has been anorexic, with significantly decreased by mouth intake. Today, the patient attempted to return to work, felt himself to be weak, lightheaded, without no focal pain, any syncope, vomiting, diarrhea. Symptoms improved with rest, the patient was then be hypotensive at work. Patient currently denies any focal pain, nausea or other complaints.   Past Medical History  Diagnosis Date  . Pneumothorax age 79    Spontaneous   . Hypertension   . Hepatic steatosis 2012 u/s  . GERD (gastroesophageal reflux disease)   . History of kidney stones   . Cyst of left kidney 2014  . PONV (postoperative nausea and vomiting) 1995  . 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 Colonoscopy  2013    Adenomatous polyp; recall 5 yrs (Dr. Loreta Ave)  . Esophagogastroduodenoscopy  2015  . Appendectomy  ~16 years ago  . Hernia repair  1995; 2000    Right spigelian hernia.  Umbill hern  . Cholecystectomy N/A 11/08/2013    Procedure: LAPAROSCOPIC CHOLECYSTECTOMY ;  Surgeon: Shelly Rubenstein, MD;  Location: [facility];  Service: General;  Laterality: N/A;   Family History  Problem Relation Age of Onset  . Coronary artery disease    . Heart disease Mother   . Early death Father     cad  . Heart disease Father    History  Substance Use Topics  . Smoking status: Never Smoker   . Smokeless tobacco: Never Used  . Alcohol Use: No    Review of Systems  Constitutional:       Per HPI, otherwise negative  HENT:   Per HPI, otherwise negative  Respiratory:       Per HPI, otherwise negative  Cardiovascular:       Per HPI, otherwise negative  Gastrointestinal: Negative for vomiting.  Endocrine:       Negative aside from HPI  Genitourinary:       Neg aside from HPI   Musculoskeletal:       Per HPI, otherwise negative  Skin: Negative.   Neurological: Positive for weakness. Negative for syncope.      Allergies  Review of patient's allergies indicates no known allergies.  Home Medications   Current Outpatient Rx  Name  Route  Sig  Dispense  Refill  . HYDROcodone-acetaminophen (NORCO/VICODIN) 5-325 MG per tablet   Oral   Take 0.5 tablets by mouth every 6 (six) hours as needed for moderate pain.         Marland Kitchen LEVITRA 20 MG tablet   Oral   Take 20 mg by mouth daily as needed for erectile dysfunction.          Marland Kitchen NASONEX 50 MCG/ACT nasal spray   Nasal   Place 2 sprays into the nose at bedtime.          Marland Kitchen olmesartan-hydrochlorothiazide (BENICAR HCT) 20-12.5 MG per tablet   Oral   Take 0.5 tablets by mouth at bedtime.         . RABEprazole (ACIPHEX) 20  MG tablet   Oral   Take 20 mg by mouth at bedtime.         . Tetrahydrozoline HCl (VISINE OP)   Both Eyes   Place 1-2 drops into both eyes daily as needed (dry eyes).         Marland Kitchen. zolpidem (AMBIEN) 10 MG tablet   Oral   Take 1 tablet by mouth at bedtime as needed for sleep.          Marland Kitchen. aspirin 81 MG tablet   Oral   Take 81 mg by mouth daily.          BP 128/82  Pulse 66  Temp(Src) 98.4 F (36.9 C) (Oral)  Resp 16  SpO2 97% Physical Exam  Nursing note and vitals reviewed. Constitutional: He is oriented to person, place, and time. He appears well-developed. No distress.  HENT:  Head: Normocephalic and atraumatic.  Eyes: Conjunctivae and EOM are normal.  Cardiovascular: Normal rate and regular rhythm.   Pulmonary/Chest: Effort normal. No stridor. No respiratory distress.  Abdominal: Soft. He exhibits no distension.  There is no tenderness. There is no guarding.  Musculoskeletal: He exhibits no edema.  Neurological: He is alert and oriented to person, place, and time.  Skin: Skin is warm and dry.  Psychiatric: He has a normal mood and affect.    ED Course  Procedures (including critical care time) Labs Review Labs Reviewed  COMPREHENSIVE METABOLIC PANEL - Abnormal; Notable for the following:    Glucose, Bld 102 (*)    Creatinine, Ser 1.38 (*)    ALT 74 (*)    GFR calc non Af Amer 57 (*)    GFR calc Af Amer 66 (*)    All other components within normal limits  URINALYSIS, ROUTINE W REFLEX MICROSCOPIC - Abnormal; Notable for the following:    APPearance CLOUDY (*)    All other components within normal limits  CBC WITH DIFFERENTIAL   Imaging Review No results found.  EKG Interpretation    Date/Time:  Monday November 14 2013 11:55:53 EST Ventricular Rate:  88 PR Interval:  141 QRS Duration: 96 QT Interval:  352 QTC Calculation: 426 R Axis:   32 Text Interpretation:  Sinus rhythm Sinus rhythm Artifact Borderline ECG Confirmed by Gerhard MunchLOCKWOOD, Lamae Fosco  MD [phone]) on 11/14/2013 2:34:04 PM           Update: Patient states that he feels substantially better, voices understanding of return precautions, follow up instructions were MDM   Final diagnoses:  Weakness    She presents one week after cholecystectomy with weakness, hypotension.  On exam patient is awake, alert, with substantially improved vital signs after fluid resuscitation.  Patient's labs are largely reassuring, and absent distress, dysrhythmia, there is low suspicion for evidence of decompensation.  Patient has a soft abdomen.  Patient will follow up with surgery, GI.    Gerhard Munchobert Zaeem Kandel, MD 11/14/13 [phone]

## 2013-11-14 NOTE — Telephone Encounter (Signed)
Patient called to report that he is feeling "swimmy headed", bloated, and lower abdominal pain.  He reports that any time he eat small bites or take sips of water his abdominal pain increases and he feels like he has eaten a huge steak even if it was only a couple bites of crackers.  Patient also reports that the nurse at this work took his BP which was 88/60.  Spoke to Dr. Magnus IvanBlackman who states that patient needs to go to the ED for evaluation.  Explained to patient Dr. Eliberto IvoryBlackman's recommendations and patient [facility].

## 2013-11-14 NOTE — Discharge Instructions (Signed)
As discussed, it is important that you follow up as soon as possible with your physician for continued management of your condition. ° °If you develop any new, or concerning changes in your condition, please return to the emergency department immediately. ° °

## 2013-11-16 ENCOUNTER — Telehealth (INDEPENDENT_AMBULATORY_CARE_PROVIDER_SITE_OTHER): Payer: Self-pay

## 2013-11-16 ENCOUNTER — Encounter (INDEPENDENT_AMBULATORY_CARE_PROVIDER_SITE_OTHER): Payer: Self-pay

## 2013-11-16 NOTE — Telephone Encounter (Signed)
Patient called in stating he was seen in ED and treated for dehydration. He is feeling a little better but still has difficulty swallowing and having abdominal pain after eating. Same symptoms he has had for 2 years now are not improving after gallbladder surgery. He has tried calling Dr Kenna GilbertMann's office for an appointment but has not got a call back from them. He feels he needs a second opinion from a GI doctor and wants to know if Dr Magnus IvanBlackman will refer him to see someone else. He would also like his post op appointment with Dr Magnus IvanBlackman moved up. I do not see any spots for Monday open so I advised him I would send this to Dr Magnus IvanBlackman and his assistant and we would call him back.

## 2013-11-21 ENCOUNTER — Other Ambulatory Visit (INDEPENDENT_AMBULATORY_CARE_PROVIDER_SITE_OTHER): Payer: Self-pay | Admitting: Surgery

## 2013-11-21 ENCOUNTER — Encounter (INDEPENDENT_AMBULATORY_CARE_PROVIDER_SITE_OTHER): Payer: Self-pay | Admitting: Surgery

## 2013-11-21 ENCOUNTER — Ambulatory Visit (INDEPENDENT_AMBULATORY_CARE_PROVIDER_SITE_OTHER): Payer: 59 | Admitting: Surgery

## 2013-11-21 VITALS — BP 128/86 | HR 78 | Temp 97.7°F | Resp 18 | Ht 74.0 in | Wt 205.6 lb

## 2013-11-21 DIAGNOSIS — Z8719 Personal history of other diseases of the digestive system: Secondary | ICD-10-CM

## 2013-11-21 DIAGNOSIS — R1011 Right upper quadrant pain: Secondary | ICD-10-CM

## 2013-11-21 DIAGNOSIS — Z09 Encounter for follow-up examination after completed treatment for conditions other than malignant neoplasm: Secondary | ICD-10-CM

## 2013-11-21 NOTE — Progress Notes (Signed)
Subjective:     Patient ID: Donald [name], male   DOB: 1961-02-26, 53 y.o.   MRN: [id number]  HPI He is here for his first postop visit status post laparoscopic cholecystectomy. He reports as some of his preoperative symptoms have completely resolved he still has some epigastric discomfort. He did have to go to the emergency department a week postop for dizziness and was found to be dehydrated. His labs were fairly normal and he responded to IV fluids. Over the last several days he reports he is improved significantly  Review of Systems     Objective:   Physical Exam On exam, he is well appearance. His abdomen is soft and his incisions are well healed. The final pathology showed chronic cholecystitis    Assessment:     Condition stable postop     Plan:     I told to refrain from any heavy lifting for one more week. He would like to see another gastroenterologist for a second opinion. I believe this is reasonable given his symptoms and discomfort. We will try to arrange this for him. I will see him back as needed

## 2013-11-25 ENCOUNTER — Encounter (INDEPENDENT_AMBULATORY_CARE_PROVIDER_SITE_OTHER): Payer: 59 | Admitting: Surgery

## 2014-06-24 IMAGING — NM NM HEPATO W/GB/PHARM/[PERSON_NAME]
1 series · 12 of 12 positions shown · non-contrast
Comparison: US ABDOMEN COMPLETE dated 07/31/2011

RADIOPHARMACEUTICALS:  Y.XmQi Ac-WWm Choletec

CLINICAL DATA: Abdominal pain, bloating

EXAM:
NUCLEAR MEDICINE HEPATOBILIARY IMAGING WITH GALLBLADDER EF
TECHNIQUE: Sequential images of the abdomen were obtained [DATE] minutes
following intravenous administration of radiopharmaceutical. After
oral ingestion of Ensure, gallbladder ejection fraction was
determined. At 60 min, normal ejection fraction is greater than 33%.

[Series 1: hepato · 4.46mm/px · 2 acquisitions, 12 frames shown]
[im 1/2]
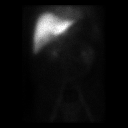
[im 1/2]
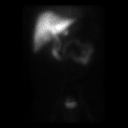
[im 1/2]
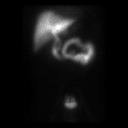
[im 1/2]
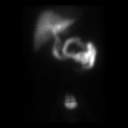
[im 1/2]
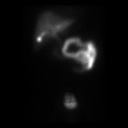
[im 1/2]
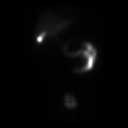
[im 2/2]
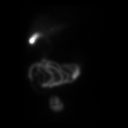
[im 2/2]
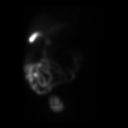
[im 2/2]
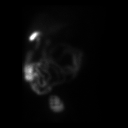
[im 2/2]
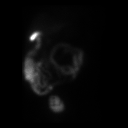
[im 2/2]
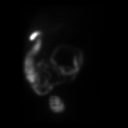
[im 2/2]
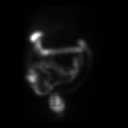

[12 of 12 positions shown; findings below may reference images not displayed]

FINDINGS: There is immediate homogeneous uptake of radiotracer in the liver.
Filling of the gallbladder begins at 50 minutes. Radiotracer uptake
is present in the small bowel at 15 minutes.

When gallbladder filling was complete, the patient was given PO
Ensure. Gallbladder ejection fraction: 31.5%. Normal gallbladder
ejection fraction with Ensure is greater than 33%. The patient did
experience symptoms after oral ingestion of Ensure.
IMPRESSION: 1.  No scintigraphic evidence of biliary obstruction.

2. Abnormal decreased gallbladder ejection fraction as can be seen
with biliary dyskinesia.

## 2014-07-16 IMAGING — CR DG CHEST 2V
2 series · 2 of 2 positions shown · non-contrast
Comparison: DG CHEST 2 VIEW dated 10/12/2010

CLINICAL DATA: Preop evaluation.

EXAM:
CHEST  2 VIEW

[w chest pa]
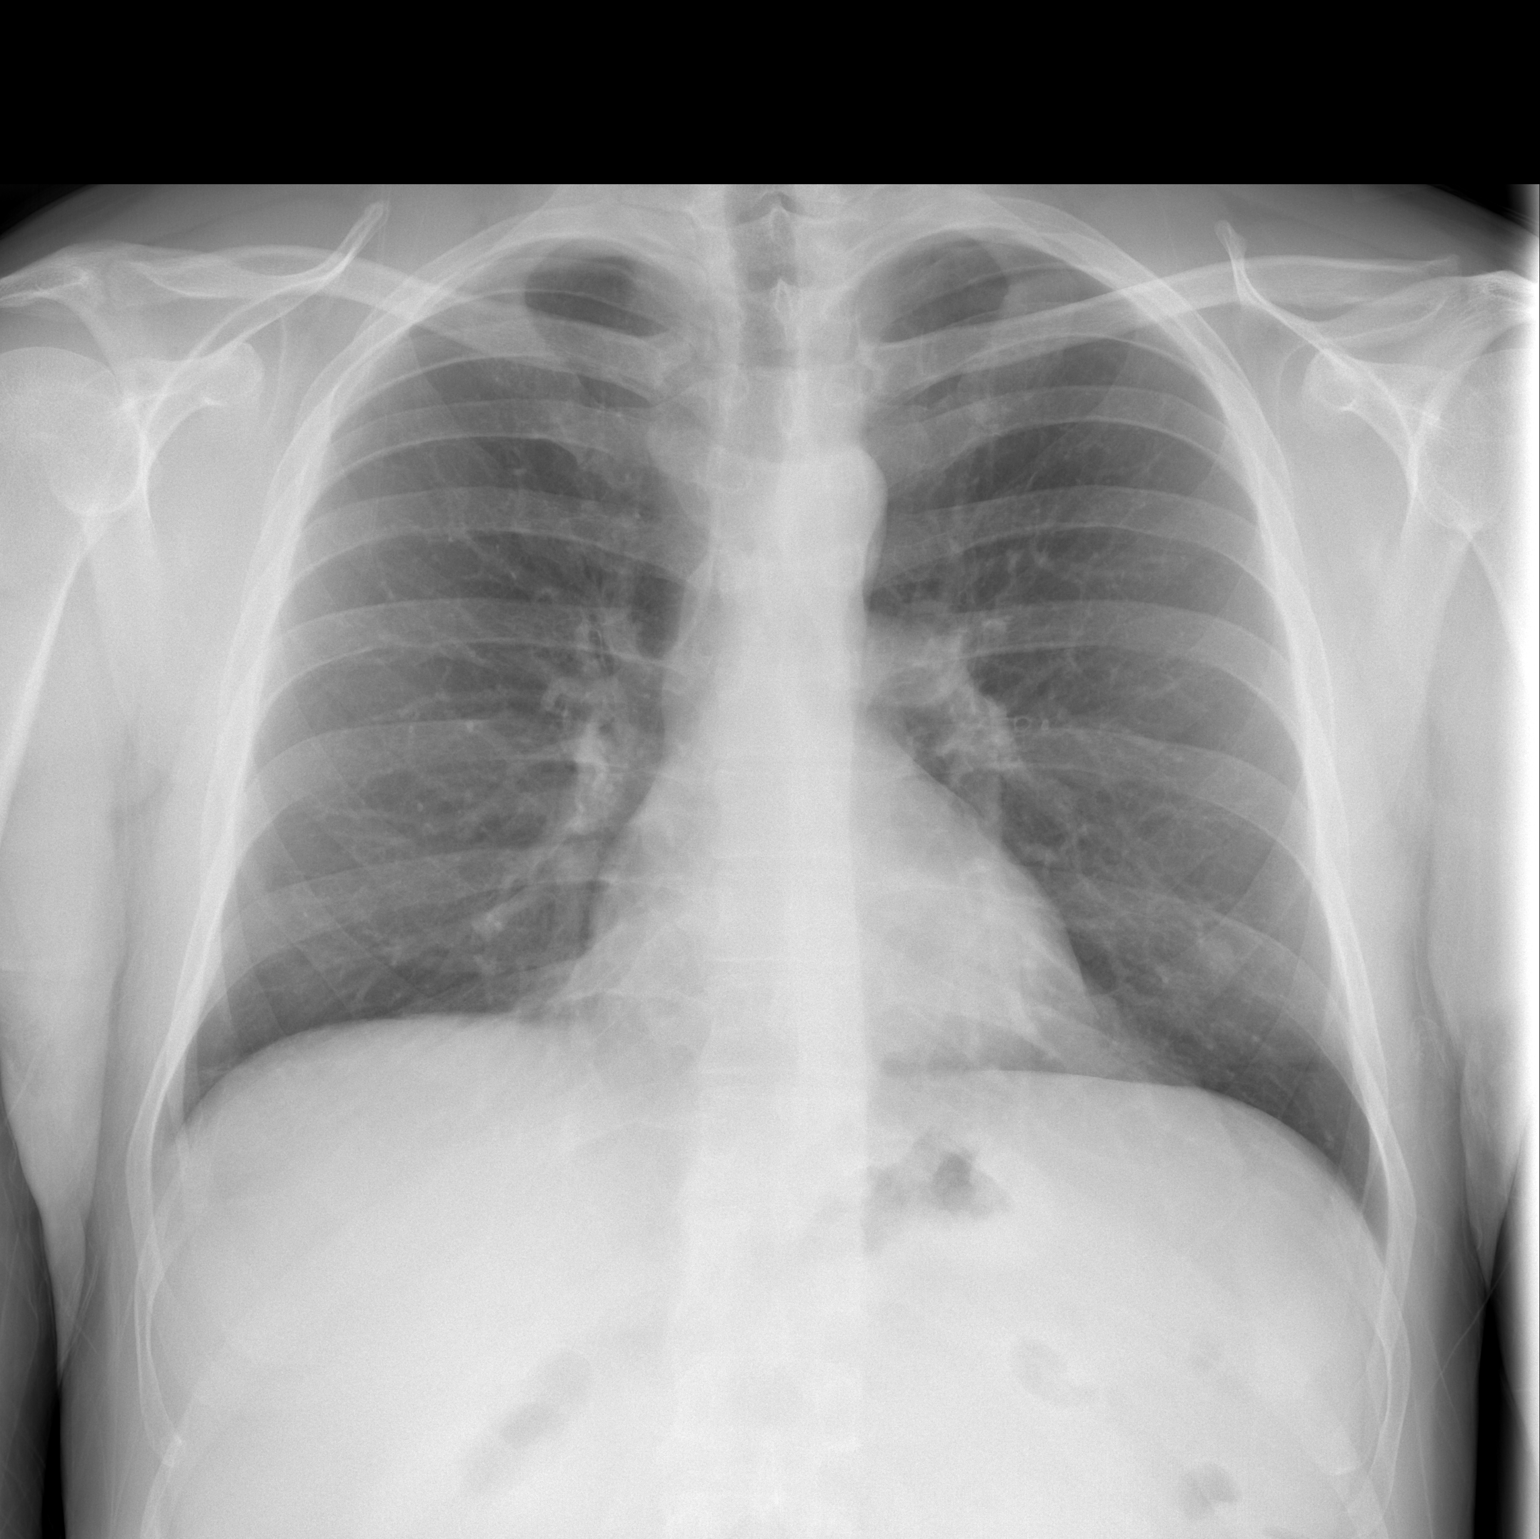

[w chest lat]
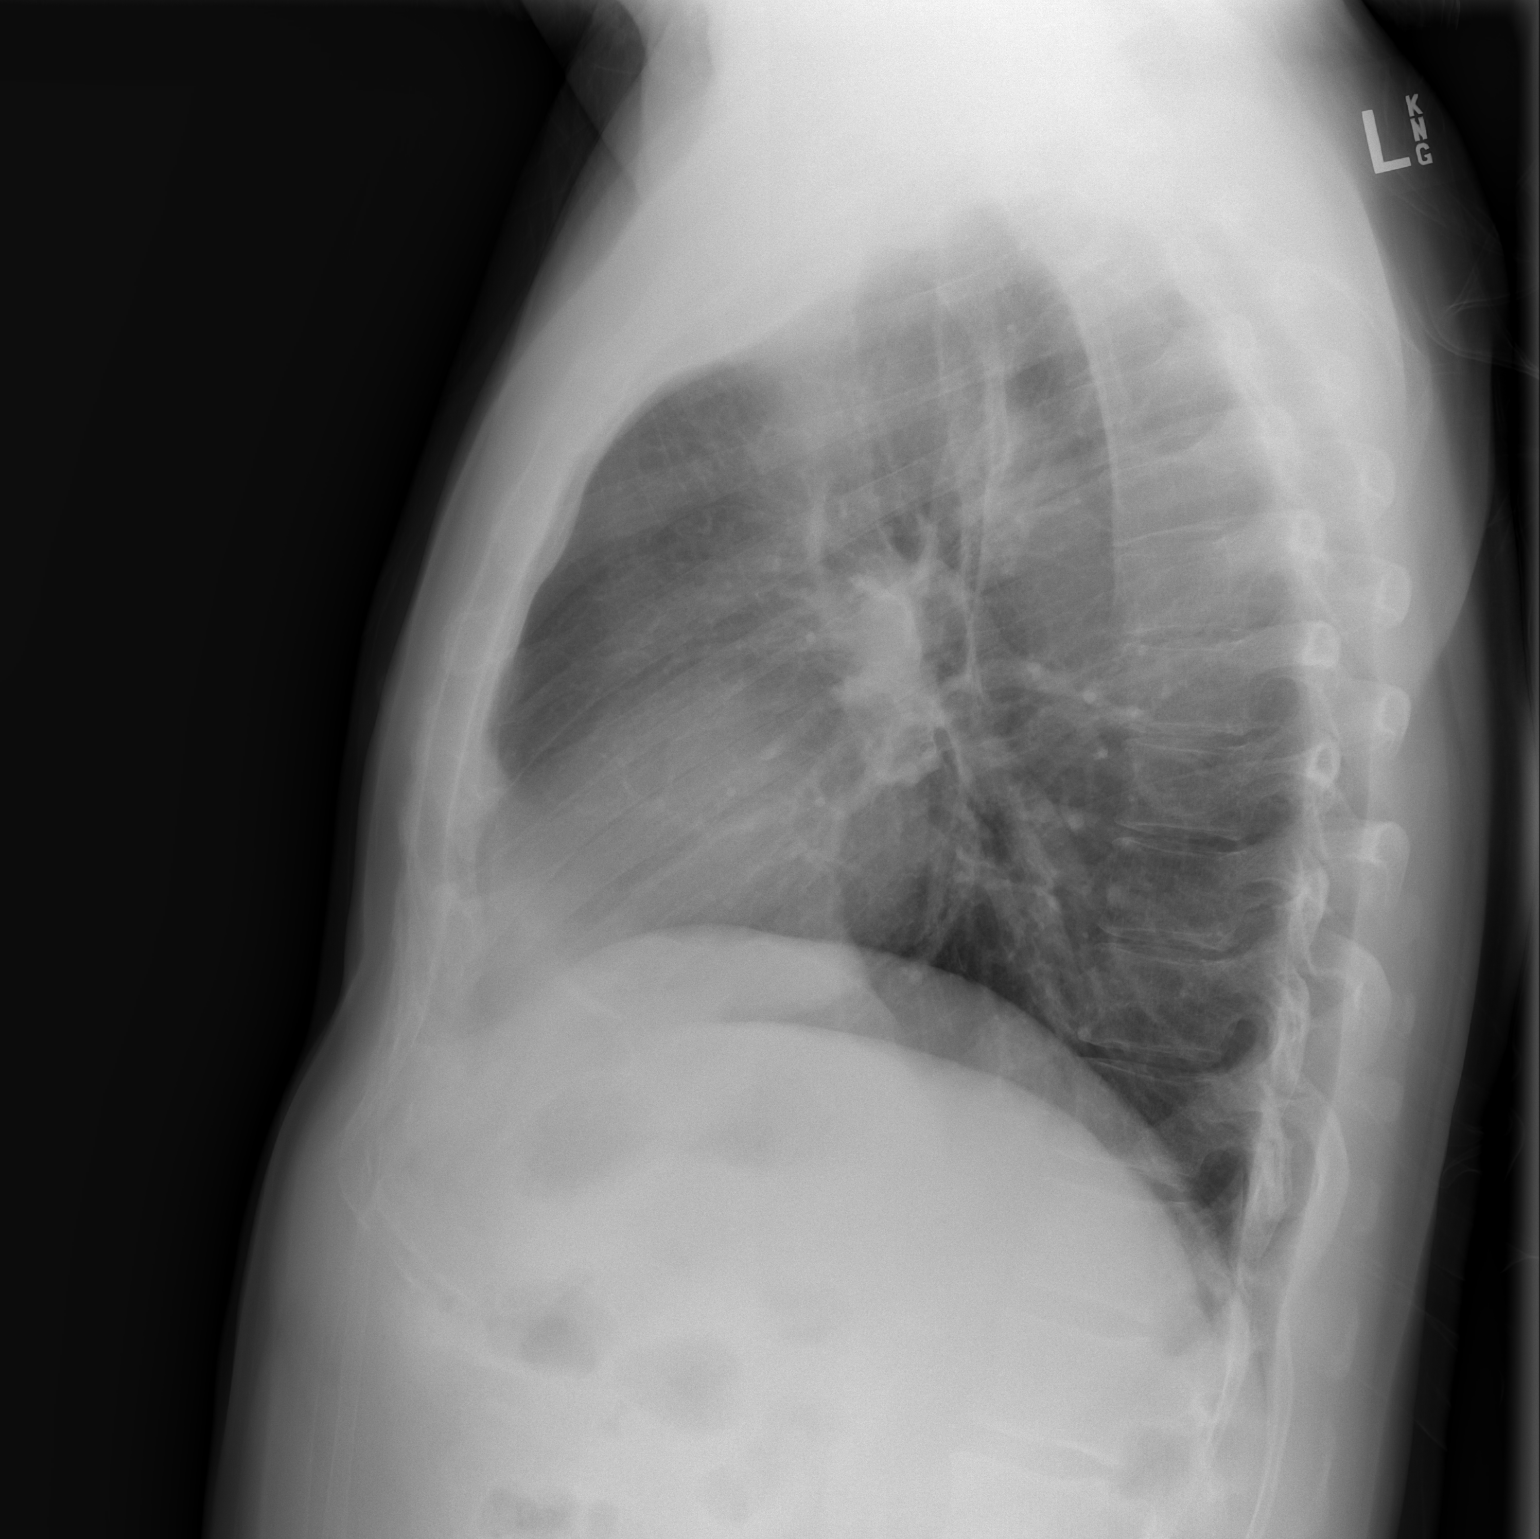

[2 of 2 positions shown; findings below may reference images not displayed]

FINDINGS: The heart size and mediastinal contours are within normal limits.
Both lungs are clear. The visualized skeletal structures are
unremarkable.
IMPRESSION: No active cardiopulmonary disease.

## 2014-08-16 ENCOUNTER — Encounter: Payer: Self-pay | Admitting: Cardiology

## 2016-07-21 ENCOUNTER — Ambulatory Visit (INDEPENDENT_AMBULATORY_CARE_PROVIDER_SITE_OTHER): Payer: Self-pay | Admitting: Orthopaedic Surgery

## 2019-12-14 ENCOUNTER — Ambulatory Visit: Payer: 59 | Attending: Internal Medicine

## 2019-12-14 DIAGNOSIS — Z20822 Contact with and (suspected) exposure to covid-19: Secondary | ICD-10-CM

## 2019-12-15 ENCOUNTER — Encounter: Payer: Self-pay | Admitting: *Deleted

## 2019-12-15 LAB — NOVEL CORONAVIRUS, NAA: SARS-CoV-2, NAA: DETECTED — AB

## 2019-12-16 ENCOUNTER — Telehealth: Payer: Self-pay | Admitting: Adult Health

## 2019-12-16 ENCOUNTER — Encounter (HOSPITAL_COMMUNITY): Payer: Self-pay

## 2019-12-16 ENCOUNTER — Other Ambulatory Visit: Payer: Self-pay | Admitting: Adult Health

## 2019-12-16 ENCOUNTER — Ambulatory Visit (HOSPITAL_COMMUNITY)
Admission: RE | Admit: 2019-12-16 | Discharge: 2019-12-16 | Disposition: A | Discharge status: Home / Self Care | Payer: 59 | Source: Ambulatory Visit | Attending: Pulmonary Disease | Admitting: Pulmonary Disease

## 2019-12-16 DIAGNOSIS — U071 COVID-19: Secondary | ICD-10-CM | POA: Diagnosis present

## 2019-12-16 MED ORDER — SODIUM CHLORIDE 0.9 % IV SOLN: INTRAVENOUS | Status: DC | PRN

## 2019-12-16 MED ORDER — METHYLPREDNISOLONE SODIUM SUCC 125 MG IJ SOLR: 125.0000 mg | Freq: Once | INTRAMUSCULAR | Status: DC | PRN

## 2019-12-16 MED ORDER — DIPHENHYDRAMINE HCL 50 MG/ML IJ SOLN: 50.0000 mg | Freq: Once | INTRAMUSCULAR | Status: DC | PRN

## 2019-12-16 MED ORDER — ALBUTEROL SULFATE HFA 108 (90 BASE) MCG/ACT IN AERS: 2.0000 | INHALATION_SPRAY | Freq: Once | RESPIRATORY_TRACT | Status: DC | PRN

## 2019-12-16 MED ORDER — EPINEPHRINE 0.3 MG/0.3ML IJ SOAJ: 0.3000 mg | Freq: Once | INTRAMUSCULAR | Status: DC | PRN

## 2019-12-16 MED ORDER — SODIUM CHLORIDE 0.9 % IV SOLN
700.0000 mg | Freq: Once | INTRAVENOUS | Status: AC
  Administered 2019-12-16: 14:00:00 700 mg via INTRAVENOUS
  Filled 2019-12-16: qty 700

## 2019-12-16 MED ORDER — FAMOTIDINE IN NACL 20-0.9 MG/50ML-% IV SOLN: 20.0000 mg | Freq: Once | INTRAVENOUS | Status: DC | PRN

## 2019-12-16 NOTE — Progress Notes (Signed)
  Diagnosis: COVID-19  Physician:wright  Procedure: [facility] Med: bamlanivimab infusion - Provided patient with bamlanimivab fact sheet for patients, parents and caregivers prior to infusion.  Complications: No immediate complications noted.  Discharge: Discharged home   Donald [name] R 12/16/2019   

## 2019-12-16 NOTE — Telephone Encounter (Signed)
  I connected by phone with Donald [name] on 12/16/2019 at 8:35 AM to discuss the potential use of an new treatment for mild to moderate COVID-19 viral infection in non-hospitalized patients.  This patient is a 59 y.o. male that meets the FDA criteria for Emergency Use Authorization of bamlanivimab or casirivimab\imdevimab.  Has a (+) direct SARS-CoV-2 viral test result  Has mild or moderate COVID-19, current sx's HA, diarrhea, post nasal gtt, intermittent non-productive cough.  Is ? 59 years of age and weighs ? 40 kg  Is NOT hospitalized due to COVID-19  Is NOT requiring oxygen therapy or requiring an increase in baseline oxygen flow rate due to COVID-19  Is within 10 days of symptom onset, onset 12/12/19  Has at least one of the high risk factor(s) for progression to severe COVID-19 and/or hospitalization as defined in EUA.  Specific high risk criteria : Hypertension, age 16   I have spoken and communicated the following to the patient or parent/caregiver:  1. FDA has authorized the emergency use of bamlanivimab and casirivimab\imdevimab for the treatment of mild to moderate COVID-19 in adults and pediatric patients with positive results of direct SARS-CoV-2 viral testing who are 65 years of age and older weighing at least 40 kg, and who are at high risk for progressing to severe COVID-19 and/or hospitalization.  2. The significant known and potential risks and benefits of bamlanivimab and casirivimab\imdevimab, and the extent to which such potential risks and benefits are unknown.  3. Information on available alternative treatments and the risks and benefits of those alternatives, including clinical trials.  4. Patients treated with bamlanivimab and casirivimab\imdevimab should continue to self-isolate and use infection control measures (e.g., wear mask, isolate, social distance, avoid sharing personal items, clean and disinfect "high touch" surfaces, and frequent handwashing)  according to CDC guidelines.   5. The patient or parent/caregiver has the option to accept or refuse bamlanivimab or casirivimab\imdevimab .  After reviewing this information with the patient,he would like to discuss with his family and then call us back with his decision.  Julaine Fusi 12/16/2019 8:35 AM

## 2019-12-16 NOTE — Discharge Instructions (Signed)

## 2019-12-16 NOTE — Telephone Encounter (Signed)
Pt spoke with family and his PCP- he would like to proceed with infusion. Placed on schedule- infusion information provided.  William Hamburger, NP-C

## 2019-12-16 NOTE — Progress Notes (Signed)
Bam orders placed

## 2022-02-03 ENCOUNTER — Encounter: Payer: Self-pay | Admitting: Physician Assistant

## 2022-02-03 ENCOUNTER — Ambulatory Visit (INDEPENDENT_AMBULATORY_CARE_PROVIDER_SITE_OTHER): Payer: 59

## 2022-02-03 ENCOUNTER — Ambulatory Visit: Payer: 59 | Admitting: Physician Assistant

## 2022-02-03 DIAGNOSIS — M79671 Pain in right foot: Secondary | ICD-10-CM

## 2022-02-03 DIAGNOSIS — M25571 Pain in right ankle and joints of right foot: Secondary | ICD-10-CM | POA: Diagnosis not present

## 2022-02-03 NOTE — Progress Notes (Signed)
? ?Office Visit Note ?  ?Patient: Donald [name]           ?Date of Birth: 06/10/1961           ?MRN: [id number] ?Visit Date: 02/03/2022 ?             ?Requested by: Delorse Lek, MD ?[id number] ?[address] ?[place],  [place] [address] ?PCP: Delorse Lek, MD ? ?Chief Complaint  ?Patient presents with  ? Right Foot - Pain  ? ? ? ? ?HPI: ?Patient is a pleasant 61 year old gentleman with a 2-year history of right lateral foot pain.  He says this began 2 years ago when he twisted his foot and boots that were too big for him.  He felt a pop and had significant pain.  He has had x-rays over the past 2 years which were either negative or questionable for fracture neither of which I have access to.  He still continues to have pain and a painful pop.  He has tried altering his shoewear which can help a little bit.  Denies any ankle pain denies any midfoot or medial pain.  It is gotten significant enough that he walks on the inside of his foot to avoid pain and is afraid this will affect some lower back problems that he has ? ?Assessment & Plan: ?Visit Diagnoses:  ?1. Pain in right foot   ?2. Pain in right ankle and joints of right foot   ? ? ?Plan: Right lateral foot pain.  Pain is not necessarily at the base of the fifth metatarsal but along the side of the metatarsal on the plantar surface.  There is no ecchymosis or bruising.  He has failed conservative treatment and this is now gone on over 2 years.  I recommended an MRI and he would like to follow-up with Dr. Cleophas Dunker who is seen in the past.  I have also discussed with him trying some lateral heel wedges to see if this offloads the pain a bit. ? ?Follow-Up Instructions: No follow-ups on file.  ? ?Ortho Exam ? ?Patient is alert, oriented, no adenopathy, well-dressed, normal affect, normal respiratory effort. ?Examination of his right foot he has a strong palpable pulse no swelling no effusion.  No bruising.  He has good strength with plantarflexion and  dorsiflexion.  Does not seem to hurt too much when he goes up onto his toes.  Minor pain with resisted eversion none with inversion.  No pain with manipulation of the Lisfranc joint.  Has some pain fullness to palpation over the plantar surface towards but not at the base of the fifth metatarsal ? ?Imaging: ?XR Foot Complete Right ? ?Result Date: 02/03/2022 ?Radiographs of his right foot were obtained in multiple projections.  Well-maintained alignment no acute degenerative changes no acute fractures  ?No images are attached to the encounter. ? ?Labs: ?No results found for: HGBA1C, ESRSEDRATE, CRP, LABURIC, REPTSTATUS, GRAMSTAIN, CULT, LABORGA ? ? ?Lab Results  ?Component Value Date  ? ALBUMIN 4.4 11/14/2013  ? ALBUMIN 4.5 10/12/2010  ? ? ?No results found for: MG ?No results found for: VD25OH ? ?No results found for: PREALBUMIN ? ?  Latest Ref Rng & Units 11/14/2013  ? 12:20 PM 11/03/2013  ?  9:00 AM 10/12/2010  ? 12:22 AM  ?CBC EXTENDED  ?WBC 4.0 - 10.5 K/uL 8.7   7.2   8.4    ?RBC 4.22 - 5.81 MIL/uL 5.11   5.32   5.28    ?  Hemoglobin 13.0 - 17.0 g/dL 97.0   26.3   78.5    ?HCT 39.0 - 52.0 % 46.0   48.1   46.8    ?Platelets 150 - 400 K/uL 203   202   140    ?NEUT# 1.7 - 7.7 K/uL 5.1    4.2    ?Lymph# 0.7 - 4.0 K/uL 2.5    3.2    ? ? ? ?There is no height or weight on file to calculate BMI. ? ?Orders:  ?Orders Placed This Encounter  ?Procedures  ? XR Foot Complete Right  ? MR Foot Right w/o contrast  ? ?No orders of the defined types were placed in this encounter. ? ? ? Procedures: ?No procedures performed ? ?Clinical Data: ?No additional findings. ? ?ROS: ? ?All other systems negative, except as noted in the HPI. ?Review of Systems ? ?Objective: ?Vital Signs: There were no vitals taken for this visit. ? ?Specialty Comments:  ?No specialty comments available. ? ?PMFS History: ?Patient Active Problem List  ? Diagnosis Date Noted  ? Pain in right ankle and joints of right foot 02/03/2022  ? Biliary dyskinesia 10/31/2013  ?  GERD (gastroesophageal reflux disease) 04/25/2013  ? Cyst of left kidney 04/25/2013  ? Plantar fasciitis of left foot 01/04/2013  ? Hypertension 09/11/2011  ? Hyperlipidemia 09/11/2011  ? ?Past Medical History:  ?Diagnosis Date  ? Cyst of left kidney 2014  ? GERD (gastroesophageal reflux disease)   ? Hepatic steatosis 2012 u/s  ? History of kidney stones   ? Hypertension   ? Pneumothorax age 25  ? Spontaneous   ? PONV (postoperative nausea and vomiting) 1995  ? 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 bilateral  ?  ?Family History  ?Problem Relation Age of Onset  ? Coronary artery disease Other   ? Heart disease Mother   ? Early death Father   ?     cad  ? Heart disease Father   ?  ?Past Surgical History:  ?Procedure Laterality Date  ? APPENDECTOMY  ~16 years ago  ? CHOLECYSTECTOMY N/A 11/08/2013  ? Procedure: LAPAROSCOPIC CHOLECYSTECTOMY ;  Surgeon: Shelly Rubenstein, MD;  Location: [facility];  Service: General;  Laterality: N/A;  ? COLONOSCOPY  2013  ? Adenomatous polyp; recall 5 yrs (Dr. Loreta Ave)  ? ESOPHAGOGASTRODUODENOSCOPY  2015  ? HERNIA REPAIR  1995; 2000  ? Right spigelian hernia.  Umbill hern  ? ?Social History  ? ?Occupational History  ? Not on file  ?Tobacco Use  ? Smoking status: Never  ? Smokeless tobacco: Never  ?Substance and Sexual Activity  ? Alcohol use: No  ? Drug use: No  ? Sexual activity: Not on file  ? ? ? ? ? ?

## 2022-02-14 ENCOUNTER — Telehealth: Payer: Self-pay | Admitting: Orthopaedic Surgery

## 2022-02-14 NOTE — Telephone Encounter (Signed)
Called pt 1X and left vm for pt to call and set an MRI Review appt with Dr. Durward Fortes after 02/28/22 ?

## 2022-02-28 ENCOUNTER — Other Ambulatory Visit: Payer: 59

## 2022-03-06 ENCOUNTER — Ambulatory Visit: Payer: 59 | Admitting: Orthopaedic Surgery

## 2022-06-09 ENCOUNTER — Emergency Department (HOSPITAL_BASED_OUTPATIENT_CLINIC_OR_DEPARTMENT_OTHER): Payer: 59

## 2022-06-09 ENCOUNTER — Encounter (HOSPITAL_BASED_OUTPATIENT_CLINIC_OR_DEPARTMENT_OTHER): Payer: Self-pay

## 2022-06-09 ENCOUNTER — Emergency Department
Admission: EM | Admit: 2022-06-09 | Discharge: 2022-06-09 | Disposition: A | Discharge status: Home / Self Care | Payer: 59 | Attending: FAMILY PRACTICE | Admitting: FAMILY PRACTICE

## 2022-06-09 ENCOUNTER — Other Ambulatory Visit: Payer: Self-pay

## 2022-06-09 DIAGNOSIS — I10 Essential (primary) hypertension: Secondary | ICD-10-CM | POA: Insufficient documentation

## 2022-06-09 DIAGNOSIS — K760 Fatty (change of) liver, not elsewhere classified: Secondary | ICD-10-CM | POA: Insufficient documentation

## 2022-06-09 DIAGNOSIS — R1032 Left lower quadrant pain: Secondary | ICD-10-CM

## 2022-06-09 DIAGNOSIS — Z87442 Personal history of urinary calculi: Secondary | ICD-10-CM | POA: Insufficient documentation

## 2022-06-09 DIAGNOSIS — K5732 Diverticulitis of large intestine without perforation or abscess without bleeding: Secondary | ICD-10-CM

## 2022-06-09 DIAGNOSIS — Z28310 Unvaccinated for covid-19: Secondary | ICD-10-CM

## 2022-06-09 LAB — CBC WITH DIFF
BASOPHIL #: 0.04 10*3/uL (ref 0.00–0.30)
BASOPHIL %: 0 % (ref 0–3)
EOSINOPHIL #: 0.11 10*3/uL (ref 0.00–0.80)
EOSINOPHIL %: 1 % (ref 0–7)
HCT: 48.7 % (ref 42.0–51.0)
HGB: 16.5 g/dL (ref 13.5–18.0)
LYMPHOCYTE #: 1.82 10*3/uL (ref 1.10–5.00)
LYMPHOCYTE %: 18 % — ABNORMAL LOW (ref 25–45)
MCH: 31.7 pg (ref 27.0–32.0)
MCHC: 34 g/dL (ref 32.0–36.0)
MCV: 93.2 fL (ref 78.0–99.0)
MONOCYTE #: 0.86 10*3/uL (ref 0.00–1.30)
MONOCYTE %: 9 % (ref 0–12)
MPV: 7.8 fL (ref 7.4–10.4)
NEUTROPHIL #: 7.09 10*3/uL (ref 1.80–8.40)
NEUTROPHIL %: 72 % (ref 40–76)
PLATELETS: 201 10*3/uL (ref 140–440)
RBC: 5.22 10*6/uL (ref 4.20–6.00)
RDW: 15.2 % — ABNORMAL HIGH (ref 11.6–14.8)
WBC: 9.9 10*3/uL (ref 4.0–10.5)

## 2022-06-09 LAB — COMPREHENSIVE METABOLIC PANEL, NON-FASTING
ALBUMIN/GLOBULIN RATIO: 1.2 (ref 0.8–1.4)
ALBUMIN: 3.7 g/dL (ref 3.4–5.0)
ALKALINE PHOSPHATASE: 81 U/L (ref 46–116)
ALT (SGPT): 30 U/L (ref <=78)
ANION GAP: 9 mmol/L (ref 4–13)
AST (SGOT): 17 U/L (ref 15–37)
BILIRUBIN TOTAL: 1.7 mg/dL — ABNORMAL HIGH (ref 0.2–1.0)
BUN/CREA RATIO: 8
BUN: 11 mg/dL (ref 7–18)
CALCIUM, CORRECTED: 9.1 mg/dL
CALCIUM: 8.8 mg/dL (ref 8.5–10.1)
CHLORIDE: 102 mmol/L (ref 98–107)
CO2 TOTAL: 30 mmol/L (ref 21–32)
CREATININE: 1.34 mg/dL — ABNORMAL HIGH (ref 0.70–1.30)
ESTIMATED GFR: 61 mL/min/{1.73_m2} (ref >59)
GLOBULIN: 3.1
GLUCOSE: 116 mg/dL — ABNORMAL HIGH (ref 74–106)
OSMOLALITY, CALCULATED: 282 mOsm/kg (ref 270–290)
POTASSIUM: 3.9 mmol/L (ref 3.5–5.1)
PROTEIN TOTAL: 6.8 g/dL (ref 6.4–8.2)
SODIUM: 141 mmol/L (ref 136–145)

## 2022-06-09 LAB — LIPASE: LIPASE: 57 U/L — ABNORMAL LOW (ref 73–393)

## 2022-06-09 MED ORDER — LEVOFLOXACIN 500 MG/100 ML IN 5 % DEXTROSE INTRAVENOUS PIGGYBACK
INJECTION | INTRAVENOUS | Status: AC
Start: 2022-06-09 — End: 2022-06-09
  Filled 2022-06-09: qty 100

## 2022-06-09 MED ORDER — LEVOFLOXACIN 500 MG TABLET
500.0000 mg | ORAL_TABLET | ORAL | Status: DC
Start: 2022-06-09 — End: 2022-06-09
  Administered 2022-06-09: 500 mg via ORAL

## 2022-06-09 MED ORDER — LEVOFLOXACIN 500 MG TABLET
500.0000 mg | ORAL_TABLET | ORAL | 0 refills | Status: AC
Start: 2022-06-09 — End: 2022-06-19

## 2022-06-09 MED ORDER — METRONIDAZOLE 500 MG TABLET
500.0000 mg | ORAL_TABLET | Freq: Three times a day (TID) | ORAL | 0 refills | Status: AC
Start: 2022-06-09 — End: 2022-06-16

## 2022-06-09 MED ORDER — LACTATED RINGERS IV BOLUS
2000.0000 mL | INJECTION | Status: AC
Start: 2022-06-09 — End: 2022-06-09
  Administered 2022-06-09: 0 mL via INTRAVENOUS
  Administered 2022-06-09: 2000 mL via INTRAVENOUS

## 2022-06-09 MED ORDER — METRONIDAZOLE 500 MG/100 ML IN SODIUM CHLOR(ISO) INTRAVENOUS PIGGYBACK
500.0000 mg | INJECTION | INTRAVENOUS | Status: AC
Start: 2022-06-09 — End: 2022-06-09
  Administered 2022-06-09: 500 mg via INTRAVENOUS
  Administered 2022-06-09: 0 mg via INTRAVENOUS

## 2022-06-09 MED ORDER — LEVOFLOXACIN 500 MG TABLET
ORAL_TABLET | ORAL | Status: AC
Start: 2022-06-09 — End: 2022-06-09
  Filled 2022-06-09: qty 1

## 2022-06-09 MED ORDER — METRONIDAZOLE 500 MG/100 ML IN SODIUM CHLOR(ISO) INTRAVENOUS PIGGYBACK
INJECTION | INTRAVENOUS | Status: AC
Start: 2022-06-09 — End: 2022-06-09
  Filled 2022-06-09: qty 100

## 2022-06-09 MED ORDER — LEVOFLOXACIN 500 MG/100 ML IN 5 % DEXTROSE INTRAVENOUS PIGGYBACK
500.0000 mg | INJECTION | INTRAVENOUS | Status: DC
Start: 2022-06-09 — End: 2022-06-09

## 2022-06-09 NOTE — ED Nurses Note (Signed)
Patient given written and verbal d/c instructions. All questions/concerns addressed. Informed to return with any concerns. Informed of prescriptions at pharmacy. Patient leaving ambulatory at this time.

## 2022-06-09 NOTE — ED Provider Notes (Signed)
[facility], [facility] Emergency Department  ED Primary Provider Note  History of Present Illness   Chief Complaint   Patient presents with    Abdominal Pain     Andre Owens. is a 61 y.o. male who had concerns including Abdominal Pain.  Arrival: The patient arrived by Car    This 61 year old male patient presents to the emergency department with left lower quadrant abdominal pain that started last night, worsened overnight into this morning he came here for evaluation since he was lying even on his right side this morning and had pain in the left abdomen.  He is had diverticulitis twice in the past, these symptoms are similar, and have started relatively quickly, so he wanted to get evaluated and treated quickly.  He is come [place], [place], is here for a week to ride the trails.  Patient has history of diverticulitis, essential hypertension, and kidney stones.  Patient does not use tobacco products, or vape.  Very rare occasional alcohol use, no illicit drug use or marijuana use.    Patient is not COVID vaccinated.    Review of Systems   Pertinent positive and negative ROS as per HPI.  Historical Data   History Reviewed This Encounter:  Patient's past medical, surgical, social history reviewed noted.    Physical Exam   ED Triage Vitals [06/09/22 1007]   BP (Non-Invasive) (!) 135/93   Heart Rate 96   Respiratory Rate 18   Temperature 36.7 C (98 F)   SpO2 97 %   Weight 93.4 kg (206 lb)   Height 1.88 m (_0 )     Physical Exam  General:  Patient appears uncomfortable but not toxic   Neck: Supple without adenopathy or accessory muscle use to breathe  Lungs:  Clear symmetrical with good aeration   Heart: Regular rate rhythm S1-S2 without murmur gallop   Abdomen:  Soft, normal bowel sounds, does have voluntary guarding in left lower quadrant, and referred pain from other areas of the abdomen into the left lower quadrant with palpation.  Extremities:  Moving  symmetrically   Skin:  No suspicious rashes lesions or edema.      Patient Data     Labs Ordered/Reviewed   COMPREHENSIVE METABOLIC PANEL, NON-FASTING - Abnormal; Notable for the following components:       Result Value    CREATININE 1.34 (*)     GLUCOSE 116 (*)     BILIRUBIN TOTAL 1.7 (*)     All other components within normal limits    Narrative:     Estimated Glomerular Filtration Rate (eGFR) is calculated using the CKD-EPI (2021) equation, intended for patients 7 years of age and older. If gender is not documented or "unknown", there will be no eGFR calculation.   LIPASE - Abnormal; Notable for the following components:    LIPASE 57 (*)     All other components within normal limits   CBC WITH DIFF - Abnormal; Notable for the following components:    RDW 15.2 (*)     LYMPHOCYTE % 18 (*)     All other components within normal limits   CBC/DIFF    Narrative:     The following orders were created for panel order CBC/DIFF.  Procedure                               Abnormality  Status                     ---------                               -----------         ------                     CBC WITH IHKV[[id number]]                Abnormal            Final result                 Please view results for these tests on the individual orders.   URINALYSIS WITH REFLEX MICROSCOPIC AND CULTURE IF POSITIVE    Narrative:     The following orders were created for panel order URINALYSIS WITH REFLEX MICROSCOPIC AND CULTURE IF POSITIVE.  Procedure                               Abnormality         Status                     ---------                               -----------         ------                     URINALYSIS, MACRO/MICRO[[id number]]                                                       Please view results for these tests on the individual orders.   URINALYSIS, MACRO/MICRO     CT ABDOMEN PELVIS WO IV CONTRAST   Final Result by Edi, Radresults In (09/04 1141)   Diverticulitis of the proximal sigmoid colon. No abscess  nor free air. Would recommend follow-up colonoscopy to ensure that there is no underlying tumor at this site.      Fatty liver.      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          One or more dose reduction techniques were used (e.g., Automated exposure control, adjustment of the mA and/or kV according to patient size, use of iterative reconstruction technique).         Radiologist location ID: [place] Making        Medical Decision Making  Moderate complexity, straightforward differential includes diverticulitis, ureterolithiasis, cystitis, prostatitis.    Amount and/or Complexity of Data Reviewed  Labs: ordered.     Details: Ordered reviewed and noted  Radiology: ordered.     Details: Ordered, reviewed and noted.    Risk  Prescription drug management.             Medications Administered in the ED   levoFLOXacin (LEVAQUIN) tablet (500 mg Oral Given 06/09/22 1219)   LR bolus infusion 2,000 mL (0 mL Intravenous Stopped 06/09/22 1159)   metroNIDAZOLE (FLAGYL)  500 mg in NS 100 mL premix IVPB (0 mg Intravenous Stopped 06/09/22 1221)     Clinical Impression   Sigmoid diverticulitis (Primary)   Left lower quadrant pain       Disposition: Discharged    .Marland KitchenBretta Bang, DO

## 2022-06-09 NOTE — ED Triage Notes (Signed)
LLQ pain   started about 2 days ago intermittent  got worse last night about 2 am  has hx of  Diverticulitis  No N/V

## 2022-06-09 NOTE — Discharge Instructions (Addendum)
You have another flare of diverticulitis, you will be given Flagyl and Levaquin to take for 10 days.  Call follow-up with primary care provider upon return to home.  Continue other home medications as prescribed, push the fluids.  You can use Tylenol, Advil, or if you need the hydrocodone you have at home from previous diverticulitis attack you can use it as well.

## 2022-06-09 NOTE — ED Nurses Note (Signed)
Patient denies any current needs at this time, wife remains at bedside. No current orders, plan of care ongoing.

## 2022-06-09 NOTE — ED Nurses Note (Signed)
Patient reports left lower quadrant pain since approximately midnight last night, interrupting sleep. Denies any nausea/indigestion. Tenderness to LLQ, bowel sounds are active. Hx diverticulitis. Pain is intermittent and sharp.

## 2022-08-07 ENCOUNTER — Ambulatory Visit
Admission: RE | Admit: 2022-08-07 | Discharge: 2022-08-07 | Disposition: A | Discharge status: Home / Self Care | Payer: 59 | Source: Ambulatory Visit | Attending: Physician Assistant | Admitting: Physician Assistant

## 2022-08-07 DIAGNOSIS — M79671 Pain in right foot: Secondary | ICD-10-CM

## 2022-08-13 ENCOUNTER — Ambulatory Visit: Payer: 59 | Admitting: Orthopaedic Surgery

## 2022-08-14 ENCOUNTER — Ambulatory Visit: Payer: 59 | Admitting: Orthopaedic Surgery

## 2022-08-14 ENCOUNTER — Encounter: Payer: Self-pay | Admitting: Orthopaedic Surgery

## 2022-08-14 DIAGNOSIS — M25571 Pain in right ankle and joints of right foot: Secondary | ICD-10-CM | POA: Diagnosis not present

## 2022-08-14 NOTE — Progress Notes (Signed)
Office Visit Note   Patient: Donald [name]           Date of Birth: 01/19/1961           MRN: [id number] Visit Date: 08/14/2022              Requested by: Delorse Lek, MD [address] [place],  [place] [address] PCP: Delorse Lek, MD   Assessment & Plan: Visit Diagnoses:  1. Pain in right ankle and joints of right foot     Plan: Donald [name] is a pleasant 61 year old gentleman with a 2-year history of right lateral foot pain.  This occurred after he twisted his foot inside his shoe that was too big for him.  Because of the continued pain over 2 years an MRI was ordered.  He is here to review it today.  His describes his pain is intermittent.  He does say that he was diagnosed with symptoms mostly in his left foot.  He has been on allopurinol and while this is helped his gouty symptoms he still has intermittent pain over the right lateral foot.  MRI was reviewed for him today.  The Lisfranc complex is intact.  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  This does coordinate where his pain is.  We discussed wearing supportive shoe wear that does not force more pressure on the lateral side of his foot.  He will continue to treat this symptomatically.  In the future if he had recurrence of consistent pain could consider an injection  Follow-Up Instructions: Return if symptoms worsen or fail to improve.   Orders:  No orders of the defined types were placed in this encounter.  No orders of the defined types were placed in this encounter.     Procedures: No procedures performed   Clinical Data: No additional findings.   Subjective: Chief Complaint  Patient presents with   Other     Scan review    HPI Donald [name] is here to discuss the results of the MRI of his right foot.  He has had a 2-year history of ongoing pain on the right lateral foot after twisting his foot in his shoe.  This did not improve.  Notices  it sometimes especially when he climbs a ladder  Review of Systems  All other systems reviewed and are negative.    Objective: Vital Signs: There were no vitals taken for this visit.  Physical Exam Constitutional:      Appearance: Normal appearance.  Pulmonary:     Effort: Pulmonary effort is normal.  Neurological:     General: No focal deficit present.     Mental Status: He is alert.  Psychiatric:        Mood and Affect: Mood normal.     Ortho Exam Examination of his right foot he has no erythema no swelling no warmth.  He has a strong dorsalis pedis and posterior tibial tendon pulse.  Brisk capillary refill in his toes.  He has good strength with eversion inversion plantarflexion and dorsiflexion.  He does have focal tenderness over the fourth tarsometatarsal joint.  No pain with manipulation of the Lisfranc complex Specialty Comments:  No specialty comments available.  Imaging: No results found.   PMFS History: Patient Active Problem List   Diagnosis Date Noted   Pain in right ankle and joints of right foot 02/03/2022   Biliary dyskinesia 10/31/2013  GERD (gastroesophageal reflux disease) 04/25/2013   Cyst of left kidney 04/25/2013   Plantar fasciitis of left foot 01/04/2013   Hypertension 09/11/2011   Hyperlipidemia 09/11/2011   Past Medical History:  Diagnosis Date   Cyst of left kidney 2014   GERD (gastroesophageal reflux disease)    Hepatic steatosis 2012 u/s   History of kidney stones    Hypertension    Pneumothorax age 24   Spontaneous    PONV (postoperative nausea and vomiting) 1995   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 Loreta Ave)   ESOPHAGOGASTRODUODENOSCOPY  2015   HERNIA REPAIR  1995; 2000   Right spigelian hernia.  Umbill hern   Social History   Occupational History   Not on file  Tobacco Use   Smoking status: Never   Smokeless tobacco: Never  Substance and Sexual Activity   Alcohol use: No   Drug use: No   Sexual activity: Not on file

## 2022-11-27 ENCOUNTER — Other Ambulatory Visit: Payer: Self-pay

## 2022-11-27 ENCOUNTER — Emergency Department (HOSPITAL_COMMUNITY)
Admission: EM | Admit: 2022-11-27 | Discharge: 2022-11-27 | Disposition: A | Discharge status: Home / Self Care | Payer: 59 | Attending: Emergency Medicine | Admitting: Emergency Medicine

## 2022-11-27 ENCOUNTER — Encounter (HOSPITAL_COMMUNITY): Payer: Self-pay

## 2022-11-27 ENCOUNTER — Emergency Department (HOSPITAL_COMMUNITY): Payer: 59

## 2022-11-27 DIAGNOSIS — Z7982 Long term (current) use of aspirin: Secondary | ICD-10-CM | POA: Diagnosis not present

## 2022-11-27 DIAGNOSIS — Z79899 Other long term (current) drug therapy: Secondary | ICD-10-CM | POA: Insufficient documentation

## 2022-11-27 DIAGNOSIS — K5732 Diverticulitis of large intestine without perforation or abscess without bleeding: Secondary | ICD-10-CM | POA: Diagnosis not present

## 2022-11-27 DIAGNOSIS — D72829 Elevated white blood cell count, unspecified: Secondary | ICD-10-CM | POA: Insufficient documentation

## 2022-11-27 DIAGNOSIS — I10 Essential (primary) hypertension: Secondary | ICD-10-CM | POA: Diagnosis not present

## 2022-11-27 DIAGNOSIS — R739 Hyperglycemia, unspecified: Secondary | ICD-10-CM | POA: Diagnosis not present

## 2022-11-27 DIAGNOSIS — R1032 Left lower quadrant pain: Secondary | ICD-10-CM | POA: Diagnosis present

## 2022-11-27 DIAGNOSIS — K5792 Diverticulitis of intestine, part unspecified, without perforation or abscess without bleeding: Secondary | ICD-10-CM

## 2022-11-27 LAB — URINALYSIS, ROUTINE W REFLEX MICROSCOPIC
Bilirubin Urine: NEGATIVE
Glucose, UA: NEGATIVE mg/dL
Hgb urine dipstick: NEGATIVE
Ketones, ur: NEGATIVE mg/dL
Leukocytes,Ua: NEGATIVE
Nitrite: NEGATIVE
Protein, ur: NEGATIVE mg/dL
Specific Gravity, Urine: 1.016 (ref 1.005–1.030)
pH: 5 (ref 5.0–8.0)

## 2022-11-27 LAB — COMPREHENSIVE METABOLIC PANEL
ALT: 44 U/L (ref 0–44)
AST: 31 U/L (ref 15–41)
Albumin: 4.2 g/dL (ref 3.5–5.0)
Alkaline Phosphatase: 69 U/L (ref 38–126)
Anion gap: 9 (ref 5–15)
BUN: 8 mg/dL (ref 8–23)
CO2: 28 mmol/L (ref 22–32)
Calcium: 9.5 mg/dL (ref 8.9–10.3)
Chloride: 101 mmol/L (ref 98–111)
Creatinine, Ser: 1.38 mg/dL — ABNORMAL HIGH (ref 0.61–1.24)
GFR, Estimated: 58 mL/min — ABNORMAL LOW (ref >60)
Glucose, Bld: 136 mg/dL — ABNORMAL HIGH (ref 70–99)
Potassium: 4.6 mmol/L (ref 3.5–5.1)
Sodium: 138 mmol/L (ref 135–145)
Total Bilirubin: 1.4 mg/dL — ABNORMAL HIGH (ref 0.3–1.2)
Total Protein: 7.1 g/dL (ref 6.5–8.1)

## 2022-11-27 LAB — CBC
HCT: 49.6 % (ref 39.0–52.0)
Hemoglobin: 16.8 g/dL (ref 13.0–17.0)
MCH: 32.1 pg (ref 26.0–34.0)
MCHC: 33.9 g/dL (ref 30.0–36.0)
MCV: 94.7 fL (ref 80.0–100.0)
Platelets: 195 10*3/uL (ref 150–400)
RBC: 5.24 MIL/uL (ref 4.22–5.81)
RDW: 13.8 % (ref 11.5–15.5)
WBC: 11 10*3/uL — ABNORMAL HIGH (ref 4.0–10.5)
nRBC: 0 % (ref 0.0–0.2)

## 2022-11-27 LAB — LIPASE, BLOOD: Lipase: 31 U/L (ref 11–51)

## 2022-11-27 MED ORDER — IOHEXOL 350 MG/ML SOLN
75.0000 mL | Freq: Once | INTRAVENOUS | Status: AC | PRN
  Administered 2022-11-27: 75 mL via INTRAVENOUS

## 2022-11-27 MED ORDER — MORPHINE SULFATE (PF) 4 MG/ML IV SOLN
4.0000 mg | Freq: Once | INTRAVENOUS | Status: AC
  Administered 2022-11-27: 4 mg via INTRAVENOUS
  Filled 2022-11-27: qty 1

## 2022-11-27 MED ORDER — AMOXICILLIN-POT CLAVULANATE 875-125 MG PO TABS: 1.0000 | ORAL_TABLET | Freq: Two times a day (BID) | ORAL | 0 refills | Status: AC

## 2022-11-27 MED ORDER — ONDANSETRON 4 MG PO TBDP: 4.0000 mg | ORAL_TABLET | Freq: Three times a day (TID) | ORAL | 0 refills | Status: DC | PRN

## 2022-11-27 MED ORDER — SODIUM CHLORIDE 0.9 % IV BOLUS
1000.0000 mL | Freq: Once | INTRAVENOUS | Status: AC
  Administered 2022-11-27: 1000 mL via INTRAVENOUS

## 2022-11-27 NOTE — ED Provider Notes (Signed)
[facility] Provider Note   CSN: [id number] Arrival date & time: 11/27/22  0945     History  Chief Complaint  Patient presents with   Donald [name]    LAJAVION DRONEN is a 62 y.o. male with a past medical history significant for hypertension, hyperlipidemia, history of diverticulitis who presents to the ED due to left lower quadrant Donald [name] for the past 2 days.  Patient states [name] is dull and has gotten worse.  Feels similar to past diverticulitis episodes.  Denies nausea, vomiting, diarrhea.  Currently follows GI.  No fever or chills.  No urinary symptoms.  History obtained from patient and past medical records. No interpreter used during encounter.       Home Medications Prior to Admission medications   Medication Sig Start Date End Date Taking? Authorizing Provider  amoxicillin-clavulanate (AUGMENTIN) 875-125 MG tablet Take 1 tablet by mouth every 12 (twelve) hours. 11/27/22  Yes Jessamyn Watterson C, PA-C  ondansetron (ZOFRAN-ODT) 4 MG disintegrating tablet Take 1 tablet (4 mg total) by mouth every 8 (eight) hours as needed for nausea or vomiting. 11/27/22  Yes Lenora Gomes, Druscilla Brownie, PA-C  allopurinol (ZYLOPRIM) 300 MG tablet Take 300 mg by mouth daily. 10/31/22   [provider]  aspirin 81 MG tablet Take 81 mg by mouth daily.    [provider]  HYDROcodone-acetaminophen (NORCO/VICODIN) 5-325 MG per tablet Take 0.5 tablets by mouth every 6 (six) hours as needed for moderate [name].    [provider]  LEVITRA 20 MG tablet Take 20 mg by mouth daily as needed for erectile dysfunction.  09/04/11   [provider]  NASONEX 50 MCG/ACT nasal spray Place 2 sprays into the nose at bedtime.  07/23/11   [provider]  olmesartan-hydrochlorothiazide (BENICAR HCT) 20-12.5 MG per tablet Take 0.5 tablets by mouth at bedtime.    [provider]  RABEprazole (ACIPHEX) 20 MG tablet Take 20 mg  by mouth at bedtime. 04/25/13   McGowen, Adrian Blackwater, MD  Tetrahydrozoline HCl (VISINE OP) Place 1-2 drops into both eyes daily as needed (dry eyes).    [provider]  zolpidem (AMBIEN) 10 MG tablet Take 1 tablet by mouth at bedtime as needed for sleep.  08/30/11   [provider]      Allergies    Patient has no allergy information on record.    Review of Systems   Review of Systems  Constitutional:  Negative for chills and fever.  Gastrointestinal:  Positive for Donald [name].    Physical Exam Updated Vital Signs BP (!) 137/99   Pulse 86   Temp 98.1 F (36.7 C)   Resp 15   Ht 6' 2"$  (1.88 m)   Wt 99.8 kg   SpO2 99%   BMI 28.25 kg/m  Physical Exam Vitals and nursing note reviewed.  Constitutional:      General: He is not in acute distress.    Appearance: He is not ill-appearing.  HENT:     Head: Normocephalic.  Eyes:     Pupils: Pupils are equal, round, and reactive to light.  Cardiovascular:     Rate and Rhythm: Normal rate and regular rhythm.     Pulses: Normal pulses.     Heart sounds: Normal heart sounds. No murmur heard.    No friction rub. No gallop.  Pulmonary:     Effort: Pulmonary effort is normal.     Breath sounds: Normal breath sounds.  Donald:     General: Abdomen is flat. There is no distension.     Palpations: Abdomen is soft.     Tenderness: There is Donald tenderness. There is no guarding or rebound.     Comments: Left sided tenderness with voluntary guarding  Musculoskeletal:        General: Normal range of motion.     Cervical back: Neck supple.  Skin:    General: Skin is warm and dry.  Neurological:     General: No focal deficit present.     Mental Status: He is alert.  Psychiatric:        Mood and Affect: Mood normal.        Behavior: Behavior normal.     ED Results / Procedures / Treatments   Labs (all labs ordered are listed, but only abnormal results are displayed) Labs Reviewed  CBC - Abnormal; Notable  for the following components:      Result Value   WBC 11.0 (*)    All other components within normal limits  COMPREHENSIVE METABOLIC PANEL - Abnormal; Notable for the following components:   Glucose, Bld 136 (*)    Creatinine, Ser 1.38 (*)    Total Bilirubin 1.4 (*)    GFR, Estimated 58 (*)    All other components within normal limits  URINALYSIS, ROUTINE W REFLEX MICROSCOPIC  LIPASE, BLOOD    EKG None  Radiology CT ABDOMEN PELVIS W CONTRAST  Result Date: 11/27/2022 CLINICAL DATA:  Donald [name] EXAM: CT ABDOMEN AND PELVIS WITH CONTRAST TECHNIQUE: Multidetector CT imaging of the abdomen and pelvis was performed using the standard protocol following bolus administration of intravenous contrast. RADIATION DOSE REDUCTION: This exam was performed according to the departmental dose-optimization program which includes automated exposure control, adjustment of the mA and/or kV according to patient size and/or use of iterative reconstruction technique. CONTRAST:  75 mL OMNIPAQUE IOHEXOL 350 MG/ML SOLN COMPARISON:  None Available. FINDINGS: Lower chest: No acute abnormality. Hepatobiliary: No focal liver abnormality is seen. Status post cholecystectomy. No biliary dilatation. Pancreas: Unremarkable. No pancreatic ductal dilatation or surrounding inflammatory changes. Spleen: Normal in size without focal abnormality. Adrenals/Urinary Tract: No adrenal lesions. Innumerable bilateral renal cysts the largest measuring 1.6 cm on the left. Additional evaluation is not necessary for this finding. No nephrolithiasis or hydronephrosis. Unremarkable urinary bladder. Stomach/Bowel: No gross abnormality identified of the stomach. No bowel dilatation to suggest obstruction. Appendix not seen. No evidence of appendicitis. Diverticulosis descending and sigmoid colon. Peridiverticular inflammatory changes consistent with acute diverticulitis of the distal descending/sigmoid colon. No abscess or obstruction.  Vascular/Lymphatic: No significant vascular findings are present. No enlarged Donald or pelvic lymph nodes. Reproductive: Prostate is unremarkable. Other: No Donald wall hernia or abnormality. No abdominopelvic ascites. Musculoskeletal: No acute or significant osseous findings. IMPRESSION: 1. Descending/sigmoid colon diverticulitis. 2. No abscess or obstruction. 3. Innumerable bilateral renal cysts. Electronically Signed   By: Sammie Bench M.D.   On: 11/27/2022 13:43    Procedures Procedures    Medications Ordered in ED Medications  sodium chloride 0.9 % bolus 1,000 mL (has no administration in time range)  iohexol (OMNIPAQUE) 350 MG/ML injection 75 mL (75 mLs Intravenous Contrast Given 11/27/22 1338)  morphine (PF) 4 MG/ML injection 4 mg (4 mg Intravenous Given 11/27/22 1405)    ED Course/ Medical Decision Making/ A&P  Medical Decision Making Amount and/or Complexity of Data Reviewed External Data Reviewed: notes. Labs: ordered. Decision-making details documented in ED Course. Radiology: ordered and independent interpretation performed. Decision-making details documented in ED Course.  Risk Prescription drug management.   This patient presents to the ED for concern of Donald [name], this involves an extensive number of treatment options, and is a complaint that carries with it a high risk of complications and morbidity.  The differential diagnosis includes diverticulitis, bowel obstruction, perforation, pyelonephritis, etc  62 year old male presents to the ED due to left-sided Donald [name] x 2 days.  Previous history of diverticulitis and notes it feels similar.  Follows GI.  Stable vitals.  Patient in no acute distress.  Tenderness throughout left side of abdomen with voluntary guarding.  Routine labs ordered.  CT abdomen to rule out evidence of diverticulitis.  IV fluids and morphine given.  CBC significant for leukocytosis at 11.  Normal  hemoglobin.  CMP significant for elevated creatinine at 1.38.  No major electrolyte derangements.  Hyperglycemia 136.  No anion gap.  Lipase normal at 31.  Doubt pancreatitis.  UA negative for signs of infection.  Doubt pyelonephritis.  CT abdomen personally reviewed and interpreted demonstrates diverticulitis.  Patient admits to improvement after morphine and IV fluids.  Patient discharged with antibiotics and advised to follow-up with GI for further evaluation.  Patient requesting Zofran with antibiotics. Strict ED precautions discussed with patient. Patient states understanding and agrees to plan. Patient discharged home in no acute distress and stable vitals  Has PCP Hx HL, HTN, diverticulitis       Final Clinical Impression(s) / ED Diagnoses Final diagnoses:  Diverticulitis    Rx / DC Orders ED Discharge Orders          Ordered    amoxicillin-clavulanate (AUGMENTIN) 875-125 MG tablet  Every 12 hours        11/27/22 1448    ondansetron (ZOFRAN-ODT) 4 MG disintegrating tablet  Every 8 hours PRN        11/27/22 1448              Karie Kirks 11/27/22 1530    Hayden Rasmussen, MD 11/27/22 1655

## 2022-11-27 NOTE — ED Triage Notes (Signed)
Pt. Stated, I started having abdominal pain 2 days , it started out dull and has gotten worse. I do have a hx of Diverticulitis.

## 2022-11-27 NOTE — Discharge Instructions (Addendum)
It was a pleasure taking care of you today.  As discussed, your CT scan showed evidence of diverticulitis.  I am sending you home with an antibiotic.  Take as prescribed and finish all antibiotics.  Please follow-up with your GI doctor.  Return to the ER for any worsening symptoms.

## 2023-09-30 ENCOUNTER — Emergency Department (HOSPITAL_BASED_OUTPATIENT_CLINIC_OR_DEPARTMENT_OTHER)
Admission: EM | Admit: 2023-09-30 | Discharge: 2023-09-30 | Disposition: A | Discharge status: Home / Self Care | Payer: 59 | Attending: Emergency Medicine | Admitting: Emergency Medicine

## 2023-09-30 ENCOUNTER — Other Ambulatory Visit: Payer: Self-pay

## 2023-09-30 ENCOUNTER — Encounter (HOSPITAL_BASED_OUTPATIENT_CLINIC_OR_DEPARTMENT_OTHER): Payer: Self-pay | Admitting: Emergency Medicine

## 2023-09-30 ENCOUNTER — Emergency Department (HOSPITAL_BASED_OUTPATIENT_CLINIC_OR_DEPARTMENT_OTHER): Payer: 59

## 2023-09-30 DIAGNOSIS — Z79899 Other long term (current) drug therapy: Secondary | ICD-10-CM | POA: Diagnosis not present

## 2023-09-30 DIAGNOSIS — I10 Essential (primary) hypertension: Secondary | ICD-10-CM | POA: Insufficient documentation

## 2023-09-30 DIAGNOSIS — Z7982 Long term (current) use of aspirin: Secondary | ICD-10-CM | POA: Diagnosis not present

## 2023-09-30 DIAGNOSIS — N201 Calculus of ureter: Secondary | ICD-10-CM

## 2023-09-30 DIAGNOSIS — N132 Hydronephrosis with renal and ureteral calculous obstruction: Secondary | ICD-10-CM | POA: Insufficient documentation

## 2023-09-30 DIAGNOSIS — R109 Unspecified abdominal pain: Secondary | ICD-10-CM | POA: Diagnosis present

## 2023-09-30 LAB — COMPREHENSIVE METABOLIC PANEL
ALT: 45 U/L — ABNORMAL HIGH (ref 0–44)
AST: 28 U/L (ref 15–41)
Albumin: 4.3 g/dL (ref 3.5–5.0)
Alkaline Phosphatase: 67 U/L (ref 38–126)
Anion gap: 8 (ref 5–15)
BUN: 16 mg/dL (ref 8–23)
CO2: 26 mmol/L (ref 22–32)
Calcium: 9.1 mg/dL (ref 8.9–10.3)
Chloride: 102 mmol/L (ref 98–111)
Creatinine, Ser: 1.56 mg/dL — ABNORMAL HIGH (ref 0.61–1.24)
GFR, Estimated: 50 mL/min — ABNORMAL LOW (ref >60)
Glucose, Bld: 106 mg/dL — ABNORMAL HIGH (ref 70–99)
Potassium: 3.5 mmol/L (ref 3.5–5.1)
Sodium: 136 mmol/L (ref 135–145)
Total Bilirubin: 0.9 mg/dL (ref <1.2)
Total Protein: 7 g/dL (ref 6.5–8.1)

## 2023-09-30 LAB — URINALYSIS, ROUTINE W REFLEX MICROSCOPIC
Bilirubin Urine: NEGATIVE
Glucose, UA: NEGATIVE mg/dL
Ketones, ur: NEGATIVE mg/dL
Leukocytes,Ua: NEGATIVE
Nitrite: NEGATIVE
Protein, ur: 30 mg/dL — AB
Specific Gravity, Urine: >=1.03 (ref 1.005–1.030)
pH: 6 (ref 5.0–8.0)

## 2023-09-30 LAB — CBC WITH DIFFERENTIAL/PLATELET
Abs Immature Granulocytes: 0.03 10*3/uL (ref 0.00–0.07)
Basophils Absolute: 0.1 10*3/uL (ref 0.0–0.1)
Basophils Relative: 1 %
Eosinophils Absolute: 0.2 10*3/uL (ref 0.0–0.5)
Eosinophils Relative: 2 %
HCT: 48.5 % (ref 39.0–52.0)
Hemoglobin: 16.7 g/dL (ref 13.0–17.0)
Immature Granulocytes: 0 %
Lymphocytes Relative: 32 %
Lymphs Abs: 2.5 10*3/uL (ref 0.7–4.0)
MCH: 31.5 pg (ref 26.0–34.0)
MCHC: 34.4 g/dL (ref 30.0–36.0)
MCV: 91.3 fL (ref 80.0–100.0)
Monocytes Absolute: 0.5 10*3/uL (ref 0.1–1.0)
Monocytes Relative: 6 %
Neutro Abs: 4.6 10*3/uL (ref 1.7–7.7)
Neutrophils Relative %: 59 %
Platelets: 225 10*3/uL (ref 150–400)
RBC: 5.31 MIL/uL (ref 4.22–5.81)
RDW: 12.8 % (ref 11.5–15.5)
WBC: 7.9 10*3/uL (ref 4.0–10.5)
nRBC: 0 % (ref 0.0–0.2)

## 2023-09-30 LAB — URINALYSIS, MICROSCOPIC (REFLEX)

## 2023-09-30 MED ORDER — MORPHINE SULFATE (PF) 4 MG/ML IV SOLN
4.0000 mg | Freq: Once | INTRAVENOUS | Status: AC
  Administered 2023-09-30: 4 mg via INTRAVENOUS
  Filled 2023-09-30: qty 1

## 2023-09-30 MED ORDER — KETOROLAC TROMETHAMINE 15 MG/ML IJ SOLN
15.0000 mg | Freq: Once | INTRAMUSCULAR | Status: AC
  Administered 2023-09-30: 15 mg via INTRAVENOUS
  Filled 2023-09-30: qty 1

## 2023-09-30 MED ORDER — ONDANSETRON HCL 4 MG/2ML IJ SOLN
4.0000 mg | Freq: Once | INTRAMUSCULAR | Status: AC
  Administered 2023-09-30: 4 mg via INTRAVENOUS
  Filled 2023-09-30: qty 2

## 2023-09-30 MED ORDER — OXYCODONE HCL 5 MG PO TABS: 5.0000 mg | ORAL_TABLET | ORAL | 0 refills | Status: AC | PRN

## 2023-09-30 MED ORDER — TAMSULOSIN HCL 0.4 MG PO CAPS: 0.4000 mg | ORAL_CAPSULE | Freq: Every day | ORAL | 0 refills | Status: AC

## 2023-09-30 MED ORDER — ONDANSETRON 4 MG PO TBDP: 4.0000 mg | ORAL_TABLET | Freq: Three times a day (TID) | ORAL | 0 refills | Status: AC | PRN

## 2023-09-30 NOTE — ED Provider Notes (Signed)
[facility] Provider Note   CSN: [id number] Arrival date & time: 09/30/23  1746     History  Chief Complaint  Patient presents with   Flank Pain    Donald [name] is a 62 y.o. male with PMH as listed below who presents with RT flank pain that started dull a few hours ago; hx of kidney stones and sts feels the same. No f/c, N/V, abdominal pain, dysuria, diarrhea/constipation. Tried advil earlier which didn't help. No h/o urologic procedures for a kidney stone.   Past Medical History:  Diagnosis Date   Cyst of left kidney 2014   GERD (gastroesophageal reflux disease)    Hepatic steatosis 2012 u/s   History of kidney stones    Hypertension    Pneumothorax age 82   Spontaneous    PONV (postoperative nausea and vomiting) 1995   Ringing in the ears    bilateral       Home Medications Prior to Admission medications   Medication Sig Start Date End Date Taking? Authorizing Provider  ondansetron (ZOFRAN-ODT) 4 MG disintegrating tablet Take 1 tablet (4 mg total) by mouth every 8 (eight) hours as needed. 09/30/23  Yes Loetta Rough, MD  oxyCODONE (ROXICODONE) 5 MG immediate release tablet Take 1 tablet (5 mg total) by mouth every 4 (four) hours as needed for up to 5 days for severe pain (pain score 7-10). 09/30/23 10/05/23 Yes Loetta Rough, MD  tamsulosin (FLOMAX) 0.4 MG CAPS capsule Take 1 capsule (0.4 mg total) by mouth daily after supper. 09/30/23 10/30/23 Yes Loetta Rough, MD  allopurinol (ZYLOPRIM) 300 MG tablet Take 300 mg by mouth daily. 10/31/22   [provider]  amoxicillin-clavulanate (AUGMENTIN) 875-125 MG tablet Take 1 tablet by mouth every 12 (twelve) hours. 11/27/22   Mannie Stabile, PA-C  aspirin 81 MG tablet Take 81 mg by mouth daily.    [provider]  LEVITRA 20 MG tablet Take 20 mg by mouth daily as needed for erectile dysfunction.  09/04/11   [provider]  NASONEX 50 MCG/ACT  nasal spray Place 2 sprays into the nose at bedtime.  07/23/11   [provider]  olmesartan-hydrochlorothiazide (BENICAR HCT) 20-12.5 MG per tablet Take 0.5 tablets by mouth at bedtime.    [provider]  RABEprazole (ACIPHEX) 20 MG tablet Take 20 mg by mouth at bedtime. 04/25/13   McGowen, Maryjean Morn, MD  Tetrahydrozoline HCl (VISINE OP) Place 1-2 drops into both eyes daily as needed (dry eyes).    [provider]  zolpidem (AMBIEN) 10 MG tablet Take 1 tablet by mouth at bedtime as needed for sleep.  08/30/11   [provider]      Allergies    Patient has no known allergies.    Review of Systems   Review of Systems A 10 point review of systems was performed and is negative unless otherwise reported in HPI.  Physical Exam Updated Vital Signs BP (!) 162/96 (BP Location: Right Arm)   Pulse 84   Temp 98 F (36.7 C)   Resp 18   Ht 6\' 2"  (1.88 m)   Wt 102.1 kg   SpO2 97%   BMI 28.89 kg/m  Physical Exam General: Uncomfortable appearing male, lying in bed.  HEENT: Sclera anicteric, MMM, trachea midline.  Cardiology: RRR, no murmurs/rubs/gallops. Resp: Normal respiratory rate and effort. CTAB, no wheezes, rhonchi, crackles.  Abd: Soft, non-tender, non-distended. No rebound tenderness or guarding.  GU: Deferred. MSK: No peripheral edema or signs of trauma.  Skin: warm, dry.  Back: +R CVA tenderness Neuro: A&Ox4, CNs II-XII grossly intact. MAEs. Sensation grossly intact.  Psych: Normal mood and affect.   ED Results / Procedures / Treatments   Labs (all labs ordered are listed, but only abnormal results are displayed) Labs Reviewed  URINALYSIS, ROUTINE W REFLEX MICROSCOPIC - Abnormal; Notable for the following components:      Result Value   Hgb urine dipstick MODERATE (*)    Protein, ur 30 (*)    All other components within normal limits  COMPREHENSIVE METABOLIC PANEL - Abnormal; Notable for the following components:   Glucose, Bld 106 (*)     Creatinine, Ser 1.56 (*)    ALT 45 (*)    GFR, Estimated 50 (*)    All other components within normal limits  URINALYSIS, MICROSCOPIC (REFLEX) - Abnormal; Notable for the following components:   Bacteria, UA FEW (*)    All other components within normal limits  CBC WITH DIFFERENTIAL/PLATELET    EKG None  Radiology CT Renal Stone Study Result Date: 09/30/2023 CLINICAL DATA:  Kidney stones.  Right flank pain. EXAM: CT ABDOMEN AND PELVIS WITHOUT CONTRAST TECHNIQUE: Multidetector CT imaging of the abdomen and pelvis was performed following the standard protocol without IV contrast. RADIATION DOSE REDUCTION: This exam was performed according to the departmental dose-optimization program which includes automated exposure control, adjustment of the mA and/or kV according to patient size and/or use of iterative reconstruction technique. COMPARISON:  CT abdomen pelvis dated 11/27/2022. FINDINGS: Evaluation of this exam is limited in the absence of intravenous contrast. Lower chest: The visualized lung bases are clear. No intra-abdominal free air or free fluid. Hepatobiliary: Fatty liver. No biliary dilatation. Cholecystectomy. No retained calcified stone noted in the central CBD. Pancreas: Unremarkable. No pancreatic ductal dilatation or surrounding inflammatory changes. Spleen: Normal in size without focal abnormality. Adrenals/Urinary Tract: The adrenal glands unremarkable. There is a punctate stone in the distal right ureter. There is mild right hydronephrosis. Punctate nonobstructing left renal interpolar calculus. No hydronephrosis on the left. The left ureter appears unremarkable. The urinary bladder is collapsed. Stomach/Bowel: There is sigmoid diverticulosis and scattered colonic diverticula without active inflammatory changes. There is no bowel obstruction or active inflammation. Appendectomy. Vascular/Lymphatic: The abdominal aorta and IVC are grossly unremarkable on this noncontrast CT. No  portal venous gas. There is no adenopathy. Reproductive: The prostate and seminal vesicles are grossly unremarkable. No pelvic mass. Other: None Musculoskeletal: No acute or significant osseous findings. IMPRESSION: 1. Punctate distal right ureteral stone with mild right hydronephrosis. 2. Punctate nonobstructing left renal interpolar calculus. No hydronephrosis on the left. 3. Fatty liver. 4. Colonic diverticulosis. No bowel obstruction. Electronically Signed   By: Elgie Collard M.D.   On: 09/30/2023 18:59    Procedures Procedures    Medications Ordered in ED Medications  ondansetron (ZOFRAN) injection 4 mg (has no administration in time range)  morphine (PF) 4 MG/ML injection 4 mg (4 mg Intravenous Given 09/30/23 1855)  ketorolac (TORADOL) 15 MG/ML injection 15 mg (15 mg Intravenous Given 09/30/23 1855)    ED Course/ Medical Decision Making/ A&P                          Medical Decision Making Amount and/or Complexity of Data Reviewed Labs: ordered. Decision-making details documented in ED Course. Radiology: ordered. Decision-making details documented in ED Course.  Risk Prescription drug management.  This patient presents to the ED for concern of R flank pain, this involves an extensive number of treatment options, and is a complaint that carries with it a high risk of complications and morbidity.  I considered the following differential and admission for this acute, potentially life threatening condition.   MDM:    Patient with history of renal stones with similar pain to kidney stone presents with right flank pain.  No fevers chills or dysuria to indicate a UTI or pyelonephritis.  Creatinine 1.56 which is similar to prior.  No leukocytosis or fever, no signs of sepsis.  CT does indeed demonstrate a punctate distal right ureteral stone with mild right hydronephrosis as well as a punctate left renal calculus.  Patient is given Toradol and morphine which significantly improved  his pain as well as Zofran for nausea vomiting as he does have 1 episode of vomiting here in the emergency department.  UA with no UTI.  Patient will be discharged with prescriptions for tamsulosin, Zofran ODT, and oxycodone.  Instructed to take Tylenol and ibuprofen for pain as well.  Given specific discharge instructions and return precautions, discussed importance of returning to the ED if spikes a fever, and will follow-up with urologist within 1 to 2 weeks.  All questions answered to patient and his wife satisfaction.  Clinical Course as of 09/30/23 1953  Wed Sep 30, 2023  1835 Urinalysis, Routine w reflex microscopic -Urine, Clean Catch(!) +hematuria, mild proteinuria, few bacteria, negative leukocytoes/nitrites, unlikely a UTI. [HN]  1835 CBC with Differential neg [HN]  1850 Creatinine(!): 1.56 Similar to priors, no electrolyte derangements [HN]  1905 CT Renal Stone Study 1. Punctate distal right ureteral stone with mild right hydronephrosis. 2. Punctate nonobstructing left renal interpolar calculus. No hydronephrosis on the left.  3. Fatty liver. 4. Colonic diverticulosis. No bowel obstruction.   [HN]    Clinical Course User Index [HN] Loetta Rough, MD    Labs: I Ordered, and personally interpreted labs.  The pertinent results include:  those listed above  Imaging Studies ordered: I ordered imaging studies including CT renal stone I independently visualized and interpreted imaging. I agree with the radiologist interpretation  Additional history obtained from chart review, wife at bedside.    Reevaluation: After the interventions noted above, I reevaluated the patient and found that they have :improved  Social Determinants of Health: Lives independently  Disposition:  DC w/ discharge instructions/return precautions. All questions answered to patient's satisfaction.    Co morbidities that complicate the patient evaluation  Past Medical History:  Diagnosis Date    Cyst of left kidney 2014   GERD (gastroesophageal reflux disease)    Hepatic steatosis 2012 u/s   History of kidney stones    Hypertension    Pneumothorax age 67   Spontaneous    PONV (postoperative nausea and vomiting) 1995   Ringing in the ears    bilateral     Medicines Meds ordered this encounter  Medications   morphine (PF) 4 MG/ML injection 4 mg   ketorolac (TORADOL) 15 MG/ML injection 15 mg   ondansetron (ZOFRAN-ODT) 4 MG disintegrating tablet    Sig: Take 1 tablet (4 mg total) by mouth every 8 (eight) hours as needed.    Dispense:  20 tablet    Refill:  0   oxyCODONE (ROXICODONE) 5 MG immediate release tablet    Sig: Take 1 tablet (5 mg total) by mouth every 4 (four) hours as needed for up to 5 days for  severe pain (pain score 7-10).    Dispense:  18 tablet    Refill:  0   tamsulosin (FLOMAX) 0.4 MG CAPS capsule    Sig: Take 1 capsule (0.4 mg total) by mouth daily after supper.    Dispense:  30 capsule    Refill:  0   ondansetron (ZOFRAN) injection 4 mg    I have reviewed the patients home medicines and have made adjustments as needed  Problem List / ED Course: Problem List Items Addressed This Visit   None Visit Diagnoses       Ureterolithiasis    -  Primary   Relevant Medications   morphine (PF) 4 MG/ML injection 4 mg (Completed)   oxyCODONE (ROXICODONE) 5 MG immediate release tablet                   This note was created using dictation software, which may contain spelling or grammatical errors.    Loetta Rough, MD 09/30/23 [phone]

## 2023-09-30 NOTE — Discharge Instructions (Addendum)
Thank you for coming to [facility] Emergency Department. You were seen for flank pain. We did an exam, labs, and imaging, and these showed ureteral stone on the right hydronephrosis (kidney swelling) and a renal stone on the left.   We have prescribed: -Zofran 4 mg under the tongue every 6-8 hours as needed for nausea/vomiting -You can alternate taking Tylenol and ibuprofen as needed for pain. You can take 650mg  tylenol (acetaminophen) every 4-6 hours, and 600 mg ibuprofen 3 times a day.  -Tamsulosin 0.4 mg every evening to help the stone pass -Oxycodone 5 mg every 4-6 hours as needed for severe pain  Please follow up with your urologist in the next 1-2 weeks.  Do not hesitate to return to the ED or call 911 if you experience: -Worsening symptoms -Lightheadedness, passing out -Fevers/chills -Anything else that concerns you

## 2023-09-30 NOTE — ED Triage Notes (Signed)
Pt c/o RT flank pain x a few hours ago; hx of kidney stones and sts feels the same

## 2023-10-04 ENCOUNTER — Emergency Department (HOSPITAL_COMMUNITY): Admission: EM | Admit: 2023-10-04 | Discharge: 2023-10-04 | Discharge status: Against Medical Advice | Payer: 59

## 2023-10-04 ENCOUNTER — Emergency Department (HOSPITAL_BASED_OUTPATIENT_CLINIC_OR_DEPARTMENT_OTHER)
Admission: EM | Admit: 2023-10-04 | Discharge: 2023-10-04 | Discharge status: Against Medical Advice | Payer: 59 | Source: Home / Self Care

## 2023-10-04 NOTE — ED Notes (Signed)
Pt did not answer to be triaged
